# Patient Record
Sex: Female | Born: 1975 | Race: White | Hispanic: No | Marital: Married | Smoking: Former smoker
Health system: Southern US, Community
[De-identification: ages and names within clinical notes are randomized; demographics above are authoritative.]

## PROBLEM LIST (undated history)

## (undated) DIAGNOSIS — R519 Headache, unspecified: Secondary | ICD-10-CM

## (undated) DIAGNOSIS — G8929 Other chronic pain: Secondary | ICD-10-CM

## (undated) DIAGNOSIS — G90A Postural orthostatic tachycardia syndrome (POTS): Secondary | ICD-10-CM

## (undated) DIAGNOSIS — T148XXA Other injury of unspecified body region, initial encounter: Secondary | ICD-10-CM

## (undated) DIAGNOSIS — R0600 Dyspnea, unspecified: Secondary | ICD-10-CM

## (undated) DIAGNOSIS — E785 Hyperlipidemia, unspecified: Secondary | ICD-10-CM

## (undated) DIAGNOSIS — J189 Pneumonia, unspecified organism: Secondary | ICD-10-CM

## (undated) DIAGNOSIS — Z87442 Personal history of urinary calculi: Secondary | ICD-10-CM

## (undated) DIAGNOSIS — F32A Depression, unspecified: Secondary | ICD-10-CM

## (undated) DIAGNOSIS — T8859XA Other complications of anesthesia, initial encounter: Secondary | ICD-10-CM

## (undated) DIAGNOSIS — D649 Anemia, unspecified: Secondary | ICD-10-CM

## (undated) DIAGNOSIS — F419 Anxiety disorder, unspecified: Secondary | ICD-10-CM

## (undated) DIAGNOSIS — I498 Other specified cardiac arrhythmias: Secondary | ICD-10-CM

## (undated) DIAGNOSIS — N200 Calculus of kidney: Secondary | ICD-10-CM

## (undated) DIAGNOSIS — I639 Cerebral infarction, unspecified: Secondary | ICD-10-CM

## (undated) DIAGNOSIS — K219 Gastro-esophageal reflux disease without esophagitis: Secondary | ICD-10-CM

## (undated) DIAGNOSIS — M797 Fibromyalgia: Secondary | ICD-10-CM

## (undated) HISTORY — DX: Postural orthostatic tachycardia syndrome (POTS): G90.A

## (undated) HISTORY — DX: Other specified cardiac arrhythmias: I49.8

## (undated) HISTORY — DX: Headache, unspecified: R51.9

## (undated) HISTORY — DX: Hyperlipidemia, unspecified: E78.5

## (undated) HISTORY — DX: Cerebral infarction, unspecified: I63.9

## (undated) HISTORY — DX: Depression, unspecified: F32.A

## (undated) HISTORY — DX: Other chronic pain: G89.29

## (undated) HISTORY — DX: Anemia, unspecified: D64.9

## (undated) HISTORY — DX: Calculus of kidney: N20.0

## (undated) HISTORY — DX: Other injury of unspecified body region, initial encounter: T14.8XXA

## (undated) HISTORY — PX: WISDOM TOOTH EXTRACTION: SHX21

## (undated) HISTORY — DX: Fibromyalgia: M79.7

## (undated) HISTORY — DX: Anxiety disorder, unspecified: F41.9

---
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
## 2012-05-04 HISTORY — PX: PARTIAL HYSTERECTOMY: SHX80

## 2017-03-08 LAB — HM PAP SMEAR: HM Pap smear: NORMAL

## 2020-03-02 ENCOUNTER — Other Ambulatory Visit: Payer: Self-pay

## 2020-03-02 ENCOUNTER — Emergency Department: Payer: 59

## 2020-03-02 ENCOUNTER — Emergency Department
Admission: EM | Admit: 2020-03-02 | Discharge: 2020-03-02 | Disposition: A | Discharge status: Home / Self Care | Payer: 59 | Attending: Emergency Medicine | Admitting: Emergency Medicine

## 2020-03-02 DIAGNOSIS — R519 Headache, unspecified: Secondary | ICD-10-CM | POA: Diagnosis not present

## 2020-03-02 DIAGNOSIS — R531 Weakness: Secondary | ICD-10-CM | POA: Diagnosis not present

## 2020-03-02 LAB — CBC WITH DIFFERENTIAL/PLATELET
Abs Immature Granulocytes: 0.02 10*3/uL (ref 0.00–0.07)
Basophils Absolute: 0.1 10*3/uL (ref 0.0–0.1)
Basophils Relative: 1 %
Eosinophils Absolute: 0.3 10*3/uL (ref 0.0–0.5)
Eosinophils Relative: 4 %
HCT: 37.8 % (ref 36.0–46.0)
Hemoglobin: 12.9 g/dL (ref 12.0–15.0)
Immature Granulocytes: 0 %
Lymphocytes Relative: 32 %
Lymphs Abs: 2.4 10*3/uL (ref 0.7–4.0)
MCH: 30.5 pg (ref 26.0–34.0)
MCHC: 34.1 g/dL (ref 30.0–36.0)
MCV: 89.4 fL (ref 80.0–100.0)
Monocytes Absolute: 0.7 10*3/uL (ref 0.1–1.0)
Monocytes Relative: 10 %
Neutro Abs: 4 10*3/uL (ref 1.7–7.7)
Neutrophils Relative %: 53 %
Platelets: 285 10*3/uL (ref 150–400)
RBC: 4.23 MIL/uL (ref 3.87–5.11)
RDW: 11.9 % (ref 11.5–15.5)
WBC: 7.5 10*3/uL (ref 4.0–10.5)
nRBC: 0 % (ref 0.0–0.2)

## 2020-03-02 LAB — URINALYSIS, COMPLETE (UACMP) WITH MICROSCOPIC
Bacteria, UA: NONE SEEN
Bilirubin Urine: NEGATIVE
Glucose, UA: NEGATIVE mg/dL
Hgb urine dipstick: NEGATIVE
Ketones, ur: NEGATIVE mg/dL
Leukocytes,Ua: NEGATIVE
Nitrite: NEGATIVE
Protein, ur: NEGATIVE mg/dL
Specific Gravity, Urine: 1.028 (ref 1.005–1.030)
WBC, UA: NONE SEEN WBC/hpf (ref 0–5)
pH: 6 (ref 5.0–8.0)

## 2020-03-02 LAB — COMPREHENSIVE METABOLIC PANEL
ALT: 37 U/L (ref 0–44)
AST: 35 U/L (ref 15–41)
Albumin: 4.5 g/dL (ref 3.5–5.0)
Alkaline Phosphatase: 81 U/L (ref 38–126)
Anion gap: 10 (ref 5–15)
BUN: 13 mg/dL (ref 6–20)
CO2: 28 mmol/L (ref 22–32)
Calcium: 9.3 mg/dL (ref 8.9–10.3)
Chloride: 102 mmol/L (ref 98–111)
Creatinine, Ser: 0.8 mg/dL (ref 0.44–1.00)
GFR, Estimated: >60 mL/min (ref >60)
Glucose, Bld: 93 mg/dL (ref 70–99)
Potassium: 3.6 mmol/L (ref 3.5–5.1)
Sodium: 140 mmol/L (ref 135–145)
Total Bilirubin: 0.7 mg/dL (ref 0.3–1.2)
Total Protein: 7.4 g/dL (ref 6.5–8.1)

## 2020-03-02 LAB — URINE DRUG SCREEN, QUALITATIVE (ARMC ONLY)
Amphetamines, Ur Screen: NOT DETECTED
Barbiturates, Ur Screen: NOT DETECTED
Benzodiazepine, Ur Scrn: NOT DETECTED
Cannabinoid 50 Ng, Ur ~~LOC~~: NOT DETECTED
Cocaine Metabolite,Ur ~~LOC~~: NOT DETECTED
MDMA (Ecstasy)Ur Screen: NOT DETECTED
Methadone Scn, Ur: NOT DETECTED
Opiate, Ur Screen: NOT DETECTED
Phencyclidine (PCP) Ur S: NOT DETECTED
Tricyclic, Ur Screen: NOT DETECTED

## 2020-03-02 MED ORDER — IOHEXOL 350 MG/ML SOLN
75.0000 mL | Freq: Once | INTRAVENOUS | Status: AC | PRN
  Administered 2020-03-02: 75 mL via INTRAVENOUS

## 2020-03-02 MED ORDER — SODIUM CHLORIDE 0.9 % IV BOLUS
1000.0000 mL | Freq: Once | INTRAVENOUS | Status: AC
  Administered 2020-03-02: 1000 mL via INTRAVENOUS

## 2020-03-02 MED ORDER — DEXAMETHASONE SODIUM PHOSPHATE 4 MG/ML IJ SOLN
4.0000 mg | Freq: Once | INTRAMUSCULAR | Status: AC
  Administered 2020-03-02: 4 mg via INTRAVENOUS
  Filled 2020-03-02: qty 1

## 2020-03-02 MED ORDER — PROCHLORPERAZINE EDISYLATE 10 MG/2ML IJ SOLN
10.0000 mg | Freq: Once | INTRAMUSCULAR | Status: AC
  Administered 2020-03-02: 10 mg via INTRAVENOUS
  Filled 2020-03-02: qty 2

## 2020-03-02 NOTE — ED Triage Notes (Signed)
Pt c/o worst headache of life.

## 2020-03-02 NOTE — Discharge Instructions (Addendum)
Please follow-up with your regular doctor.  If you do not have a local doctor you can try [facility] or [facility] or [facility] medical or Dr. Elisabeth Cara practice.  I do not remember what his group is called now.  They are all very good.  They can have you follow-up with neurology if need be.  Please return here if you get a headache as severe as this 1 again.  I think this was a migraine.

## 2020-03-02 NOTE — ED Notes (Addendum)
Pt reports sudden headache today. Worst in her life. Reports weakness in BLE, denies trauma/injury, LOC. LKN [id number]

## 2020-03-02 NOTE — ED Notes (Signed)
Sig pad not working. AVS given to patient, patient verbalized understanding with no questions.

## 2020-03-02 NOTE — ED Provider Notes (Signed)
[facility] Emergency Department Provider Note   ____________________________________________   First MD Initiated Contact with Patient 03/02/20 1829     (approximate)  I have reviewed the triage vital signs and the nursing notes.   HISTORY  Chief Complaint Loss of Consciousness History limited by severe headache  HPI Jean Rios is a 44 y.o. female who comes in with her husband.  He gives the history as she is not feeling well at all.  He reports she had a mild headache when they went out to eat it pumpkin patch and when she got home she was not feeling well laid down and she called him while he was cooking dinner and she said she did not feel good wanted to go to the emergency room and then she became very limp.  Passed out.  She has a history of an old stroke and 2009 at that time she lost the ability to speak for about a week or 2.  That has resolved and she only occasionally has a little bit of word finding difficulty now.  Patient herself told me that she had sudden or very rapid onset of the worst headache she has ever had.  She does have a history of migraines but she is never had anything quite this bad.  Headache appears to be diffuse.  She is not having nausea or vomiting at this time.         No past medical history on file. Patient reportedly has a history of an old stroke/TIA with no deficits There are no problems to display for this patient.     Prior to Admission medications   Not on File    Allergies Patient has no known allergies.  No family history on file.  Social History Social History   Tobacco Use  . Smoking status: Not on file  Substance Use Topics  . Alcohol use: Not on file  . Drug use: Not on file    Review of Systems  Constitutional: No fever/chills Eyes: No visual changes. ENT: No sore throat. Cardiovascular: Denies chest pain. Respiratory: Denies shortness of breath. Gastrointestinal: No  abdominal pain.  No nausea, no vomiting.  No diarrhea.  No constipation. Genitourinary: Negative for dysuria. Musculoskeletal: Negative for back pain. Skin: Negative for rash. Neurological: Negative for focal weakness   ____________________________________________   PHYSICAL EXAM:  VITAL SIGNS: ED Triage Vitals  Enc Vitals Group     BP 03/02/20 1823 (!) 160/88     Pulse Rate 03/02/20 1823 73     Resp 03/02/20 1823 17     Temp --      Temp src --      SpO2 03/02/20 1823 100 %     Weight --      Height --      Head Circumference --      Peak Flow --      Pain Score 03/02/20 1824 10     Pain Loc --      Pain Edu? --      Excl. in GC? --     Constitutional: Alert Complaining of severe headache Eyes: Patient reports her eyes hurt when she looks at light.  Was able to open her eyes briefly.  Her pupils are equal and round and reactive conjunctiva are clear Head: Atraumatic. Nose: No congestion/rhinnorhea. Mouth/Throat: Mucous membranes are moist.  Oropharynx non-erythematous. Neck: No stridor.   Cardiovascular: Normal rate, regular rhythm. Grossly normal heart sounds.  Good peripheral  circulation. Respiratory: Normal respiratory effort.  No retractions. Lungs CTAB. Gastrointestinal: Soft and nontender. No distention. No abdominal bruits.  Musculoskeletal: No lower extremity tenderness nor edema.   Neurologic: Patient is diffusely weak.  She has difficulty lifting her legs off the bed bilaterally.  She has difficulty holding her arms up with her palms out.  She has difficulty squeezing my fingers hard.. Skin:  Skin is warm, dry and intact. No rash noted.   ____________________________________________   LABS (all labs ordered are listed, but only abnormal results are displayed)  Labs Reviewed  URINALYSIS, COMPLETE (UACMP) WITH MICROSCOPIC - Abnormal; Notable for the following components:      Result Value   Color, Urine STRAW (*)    APPearance CLEAR (*)    All other  components within normal limits  COMPREHENSIVE METABOLIC PANEL  URINE DRUG SCREEN, QUALITATIVE ([facility] ONLY)  CBC WITH DIFFERENTIAL/PLATELET   ____________________________________________  EKG  EKG read interpreted by me shows normal sinus rhythm rate of 64 normal axis there is a flipped T isolated in V3 otherwise no acute ST-T changes ____________________________________________  RADIOLOGY Jill Poling, personally viewed and evaluated these images (plain radiographs) as part of my medical decision making, as well as reviewing the written report by the radiologist.  ED MD interpretation:    Official radiology report(s): CT Angio Head W or Wo Contrast  Result Date: 03/02/2020 CLINICAL DATA:  Stroke/TIA. EXAM: CT ANGIOGRAPHY HEAD TECHNIQUE: Multidetector CT imaging of the head was performed using the standard protocol during bolus administration of intravenous contrast. Multiplanar CT image reconstructions and MIPs were obtained to evaluate the vascular anatomy. CONTRAST:  52mL OMNIPAQUE IOHEXOL 350 MG/ML SOLN COMPARISON:  03/02/2020 noncontrast head CT. FINDINGS: CTA HEAD Anterior circulation: Patent. No significant stenosis, proximal occlusion, aneurysm, or vascular malformation. Posterior circulation: Patent. No significant stenosis, proximal occlusion, aneurysm, or vascular malformation. Venous sinuses: As permitted by contrast timing, patent. Anatomic variants: The left PCOM is either diminutive or congenitally absent. IMPRESSION: No large vessel occlusion, high-grade narrowing, dissection or aneurysm. Electronically Signed   By: Stana Bunting M.D.   On: 03/02/2020 20:51   CT Head Wo Contrast  Result Date: 03/02/2020 CLINICAL DATA:  Headache with bilateral lower extremity weakness. EXAM: CT HEAD WITHOUT CONTRAST TECHNIQUE: Contiguous axial images were obtained from the base of the skull through the vertex without intravenous contrast. COMPARISON:  None. FINDINGS: Brain: No  evidence of acute infarction, hemorrhage, hydrocephalus, extra-axial collection or mass lesion/mass effect. Vascular: No hyperdense vessel or unexpected calcification. Skull: Normal. Negative for fracture or focal lesion. Sinuses/Orbits: No acute finding. Other: None. IMPRESSION: No acute intracranial pathology. Electronically Signed   By: Aram Candela M.D.   On: 03/02/2020 19:13    ____________________________________________   PROCEDURES  Procedure(s) performed (including Critical Care):  Procedures   ____________________________________________   INITIAL IMPRESSION / ASSESSMENT AND PLAN / ED COURSE Husband reports patient could not be pregnant.  She has had a partial hysterectomy and her tubes have been tied and burned. ----------------------------------------- 11:00 PM on 03/02/2020 -----------------------------------------  Patient's headache is now much better.  CT and CT angio were both negative.  She should be safe to go.  I will let her go home come back if she has any further problems at all and follow-up with local doctor since she only has one in [place] now.             ____________________________________________   FINAL CLINICAL IMPRESSION(S) / ED DIAGNOSES  Final diagnoses:  Nonintractable  headache, unspecified chronicity pattern, unspecified headache type     ED Discharge Orders    None      *Please note:  Jean Rios was evaluated in Emergency Department on 03/02/2020 for the symptoms described in the history of present illness. She was evaluated in the context of the global COVID-19 pandemic, which necessitated consideration that the patient might be at risk for infection with the SARS-CoV-2 virus that causes COVID-19. Institutional protocols and algorithms that pertain to the evaluation of patients at risk for COVID-19 are in a state of rapid change based on information released by regulatory bodies including the CDC and federal and  state organizations. These policies and algorithms were followed during the patient's care in the ED.  Some ED evaluations and interventions may be delayed as a result of limited staffing during and the pandemic.*   Note:  This document was prepared using Dragon voice recognition software and may include unintentional dictation errors.    Arnaldo Natal, MD 03/02/20 [phone]

## 2021-01-21 ENCOUNTER — Other Ambulatory Visit: Payer: Self-pay

## 2021-01-21 ENCOUNTER — Ambulatory Visit (INDEPENDENT_AMBULATORY_CARE_PROVIDER_SITE_OTHER): Payer: 59 | Admitting: Internal Medicine

## 2021-01-21 ENCOUNTER — Encounter: Payer: Self-pay | Admitting: Internal Medicine

## 2021-01-21 VITALS — BP 101/59 | HR 85 | Temp 98.6°F | Resp 18 | Ht 64.0 in | Wt 148.8 lb

## 2021-01-21 DIAGNOSIS — R519 Headache, unspecified: Secondary | ICD-10-CM | POA: Insufficient documentation

## 2021-01-21 DIAGNOSIS — F32A Depression, unspecified: Secondary | ICD-10-CM

## 2021-01-21 DIAGNOSIS — M797 Fibromyalgia: Secondary | ICD-10-CM | POA: Insufficient documentation

## 2021-01-21 DIAGNOSIS — R7989 Other specified abnormal findings of blood chemistry: Secondary | ICD-10-CM

## 2021-01-21 DIAGNOSIS — G90A Postural orthostatic tachycardia syndrome (POTS): Secondary | ICD-10-CM

## 2021-01-21 DIAGNOSIS — F419 Anxiety disorder, unspecified: Secondary | ICD-10-CM | POA: Insufficient documentation

## 2021-01-21 DIAGNOSIS — D500 Iron deficiency anemia secondary to blood loss (chronic): Secondary | ICD-10-CM

## 2021-01-21 DIAGNOSIS — D649 Anemia, unspecified: Secondary | ICD-10-CM | POA: Insufficient documentation

## 2021-01-21 DIAGNOSIS — E7800 Pure hypercholesterolemia, unspecified: Secondary | ICD-10-CM | POA: Insufficient documentation

## 2021-01-21 DIAGNOSIS — G8929 Other chronic pain: Secondary | ICD-10-CM | POA: Insufficient documentation

## 2021-01-21 DIAGNOSIS — G4700 Insomnia, unspecified: Secondary | ICD-10-CM | POA: Insufficient documentation

## 2021-01-21 DIAGNOSIS — Z8673 Personal history of transient ischemic attack (TIA), and cerebral infarction without residual deficits: Secondary | ICD-10-CM | POA: Insufficient documentation

## 2021-01-21 DIAGNOSIS — F5104 Psychophysiologic insomnia: Secondary | ICD-10-CM

## 2021-01-21 DIAGNOSIS — M5441 Lumbago with sciatica, right side: Secondary | ICD-10-CM

## 2021-01-21 DIAGNOSIS — M549 Dorsalgia, unspecified: Secondary | ICD-10-CM | POA: Insufficient documentation

## 2021-01-21 DIAGNOSIS — M5442 Lumbago with sciatica, left side: Secondary | ICD-10-CM

## 2021-01-21 DIAGNOSIS — I498 Other specified cardiac arrhythmias: Secondary | ICD-10-CM | POA: Insufficient documentation

## 2021-01-21 LAB — COMPLETE METABOLIC PANEL WITH GFR
AG Ratio: 1.8 (calc) (ref 1.0–2.5)
ALT: 15 U/L (ref 6–29)
AST: 17 U/L (ref 10–30)
Albumin: 4.4 g/dL (ref 3.6–5.1)
Alkaline phosphatase (APISO): 72 U/L (ref 31–125)
BUN/Creatinine Ratio: 11 (calc) (ref 6–22)
BUN: 15 mg/dL (ref 7–25)
CO2: 30 mmol/L (ref 20–32)
Calcium: 9.4 mg/dL (ref 8.6–10.2)
Chloride: 104 mmol/L (ref 98–110)
Creat: 1.31 mg/dL — ABNORMAL HIGH (ref 0.50–0.99)
Globulin: 2.4 g/dL (calc) (ref 1.9–3.7)
Glucose, Bld: 86 mg/dL (ref 65–139)
Potassium: 4 mmol/L (ref 3.5–5.3)
Sodium: 140 mmol/L (ref 135–146)
Total Bilirubin: 0.5 mg/dL (ref 0.2–1.2)
Total Protein: 6.8 g/dL (ref 6.1–8.1)
eGFR: 52 mL/min/{1.73_m2} — ABNORMAL LOW (ref >=60)

## 2021-01-21 LAB — LIPID PANEL
Cholesterol: 251 mg/dL — ABNORMAL HIGH (ref <200)
HDL: 53 mg/dL (ref >=50)
LDL Cholesterol (Calc): 176 mg/dL (calc) — ABNORMAL HIGH
Non-HDL Cholesterol (Calc): 198 mg/dL (calc) — ABNORMAL HIGH (ref <130)
Total CHOL/HDL Ratio: 4.7 (calc) (ref <5.0)
Triglycerides: 102 mg/dL (ref <150)

## 2021-01-21 MED ORDER — VENLAFAXINE HCL ER 75 MG PO CP24: 75.0000 mg | ORAL_CAPSULE | Freq: Every day | ORAL | 2 refills | Status: DC

## 2021-01-21 NOTE — Assessment & Plan Note (Signed)
CMET and lipid profile today Encouraged her to consume a low fat diet 

## 2021-01-21 NOTE — Assessment & Plan Note (Signed)
Encouraged her to try to avoid triggers Continue Ibuprofen as needed

## 2021-01-21 NOTE — Assessment & Plan Note (Signed)
Will trial Venlafaxine 75 mg daily Referral to psychology placed for therapy Support offered  Update me in 1 month via mychart and let me know how you are doing

## 2021-01-21 NOTE — Progress Notes (Signed)
HPI  Pt presents to the clinic today to establish care and for management of the conditions listed below. She recently moved from [place].  Anemia: Her last H/H was 12.9/37.8, 02/2020. She is not currently taking an oral iron supplement.  She does not follow with hematology.  Anxiety and Depression: Chronic. She has taken Escitalopram in the past but stopped taking it because she felt like it was no longer effective. She has also take Paroxetine, Citalopram and Abilify in the past. She is not currently seeing a therapist. She denies SI/HI.  Insomnia: She has difficulty staying asleep. She has take Hydroxyzine in the past but reports it is not effective in helping her stay asleep. She has tried Trazadone in the past. There is no sleep study on file.  HLD with History of TIA: There is no lipid profile in Epic. She reports some residual expressive aphasia, with right sided weakness. She has been out of her Atorvastatin for almost a year.  Frequent Headaches: These occur 1-2 times per week. She reports these are triggered by weather, eye strain. She takes Ibuprofen and rests in a dark room with good relief of symptoms.  POTS: She denies recent syncopal episode. She tries to avoid standing up too fast.  Chronic Back Pain/Fibromyalgia: There is no xray or MRI to review. She reports this is the cause of a work injury. She takes CBD and had a medical marijuana for severe pain.  Past Medical History:  Diagnosis Date   Anemia    Anxiety    Depression    Frequent headaches    Hyperlipidemia    Kidney stone    Stroke [facility])     No current outpatient medications on file.   No current facility-administered medications for this visit.    Allergies  Allergen Reactions   Cinnamon Itching    Throat gets irritated, and have difficulty to breath    No family history on file.  Social History   Socioeconomic History   Marital status: Married    Spouse name: Not on file   Number of children:  Not on file   Years of education: Not on file   Highest education level: Not on file  Occupational History   Not on file  Tobacco Use   Smoking status: Not on file   Smokeless tobacco: Not on file  Substance and Sexual Activity   Alcohol use: Not on file   Drug use: Not on file   Sexual activity: Not on file  Other Topics Concern   Not on file  Social History Narrative   Not on file   Social Determinants of Health   Financial Resource Strain: Not on file  Food Insecurity: Not on file  Transportation Needs: Not on file  Physical Activity: Not on file  Stress: Not on file  Social Connections: Not on file  Intimate Partner Violence: Not on file    ROS:  Constitutional: Pt reports headaches. Denies fever, malaise, fatigue, or abrupt weight changes.  HEENT: Denies eye pain, eye redness, ear pain, ringing in the ears, wax buildup, runny nose, nasal congestion, bloody nose, or sore throat. Respiratory: Denies difficulty breathing, shortness of breath, cough or sputum production.   Cardiovascular: Denies chest pain, chest tightness, palpitations or swelling in the hands or feet.  Gastrointestinal: Denies abdominal pain, bloating, constipation, diarrhea or blood in the stool.  GU: Denies frequency, urgency, pain with urination, blood in urine, odor or discharge. Musculoskeletal: Pt reports chronic back pain, muscle pain.  Denies decrease in range of motion, difficulty with gait, or joint swelling.  Skin: Denies redness, rashes, lesions or ulcercations.  Neurological: Pt reports numbness and tingling in her extremities, expressive aphasia, right sided weakness, insomnia. Denies dizziness, difficulty with memory, difficulty with speech or problems with balance and coordination.  Psych: Pt has a history of anxiety and depression. Denies SI/HI.  No other specific complaints in a complete review of systems (except as listed in HPI above).  PE:  BP (!) 101/59 (BP Location: Right Arm,  Patient Position: Sitting, Cuff Size: Normal)   Pulse 85   Temp 98.6 F (37 C) (Temporal)   Resp 18   Ht 5\' 4"  (1.626 m)   Wt 148 lb 12.8 oz (67.5 kg)   SpO2 99%   BMI 25.54 kg/m  Wt Readings from Last 3 Encounters:  01/21/21 148 lb 12.8 oz (67.5 kg)    General: Appears her stated age, overweight in NAD. Skin: Dry and intact. HEENT: Head: normal shape and size; Eyes: sclera white and EOMs intact;  Cardiovascular: Normal rate and rhythm. S1,S2 noted.  No murmur, rubs or gallops noted. No JVD or BLE edema. No carotid bruits noted. Pulmonary/Chest: Normal effort and positive vesicular breath sounds. No respiratory distress. No wheezes, rales or ronchi noted.  Musculoskeletal: Strength 5/5 BUE/BLE. No difficulty with gait.  Neurological: Alert and oriented.  Psychiatric: Mood and affect normal. Behavior is normal. Judgment and thought content normal.     BMET    Component Value Date/Time   NA 140 03/02/2020 1828   K 3.6 03/02/2020 1828   CL 102 03/02/2020 1828   CO2 28 03/02/2020 1828   GLUCOSE 93 03/02/2020 1828   BUN 13 03/02/2020 1828   CREATININE 0.80 03/02/2020 1828   CALCIUM 9.3 03/02/2020 1828   GFRNONAA >60 03/02/2020 1828    Lipid Panel  No results found for: CHOL, TRIG, HDL, CHOLHDL, VLDL, LDLCALC  CBC    Component Value Date/Time   WBC 7.5 03/02/2020 1828   RBC 4.23 03/02/2020 1828   HGB 12.9 03/02/2020 1828   HCT 37.8 03/02/2020 1828   PLT 285 03/02/2020 1828   MCV 89.4 03/02/2020 1828   MCH 30.5 03/02/2020 1828   MCHC 34.1 03/02/2020 1828   RDW 11.9 03/02/2020 1828   LYMPHSABS 2.4 03/02/2020 1828   MONOABS 0.7 03/02/2020 1828   EOSABS 0.3 03/02/2020 1828   BASOSABS 0.1 03/02/2020 1828    Hgb A1C No results found for: HGBA1C   Assessment and Plan:   03/04/2020, NP This visit occurred during the SARS-CoV-2 public health emergency.  Safety protocols were in place, including screening questions prior to the visit, additional usage of staff  PPE, and extensive cleaning of exam room while observing appropriate contact time as indicated for disinfecting solutions.

## 2021-01-21 NOTE — Assessment & Plan Note (Signed)
Currently not an issue Will monitor CBC yearly

## 2021-01-21 NOTE — Patient Instructions (Signed)

## 2021-01-21 NOTE — Assessment & Plan Note (Signed)
Will trial Trazadone again once she is stabalized on Venlafaxine

## 2021-01-21 NOTE — Assessment & Plan Note (Signed)
Encouraged her to change positions slowly Encouraged adequate water intake

## 2021-01-21 NOTE — Assessment & Plan Note (Signed)
Encouraged regular stretching and core strengthening

## 2021-01-21 NOTE — Assessment & Plan Note (Signed)
Encouraged regular stretching and core strengthening 

## 2021-01-22 ENCOUNTER — Other Ambulatory Visit: Payer: Self-pay

## 2021-01-22 DIAGNOSIS — E7800 Pure hypercholesterolemia, unspecified: Secondary | ICD-10-CM

## 2021-01-22 MED ORDER — ATORVASTATIN CALCIUM 10 MG PO TABS: 10.0000 mg | ORAL_TABLET | Freq: Every day | ORAL | 3 refills | Status: DC

## 2021-01-22 NOTE — Addendum Note (Signed)
Addended by: Lorre Munroe on: 01/22/2021 10:16 AM   Modules accepted: Orders

## 2021-01-31 ENCOUNTER — Encounter: Payer: Self-pay | Admitting: Internal Medicine

## 2021-02-04 ENCOUNTER — Other Ambulatory Visit: Payer: Self-pay

## 2021-02-04 DIAGNOSIS — R7989 Other specified abnormal findings of blood chemistry: Secondary | ICD-10-CM

## 2021-02-05 LAB — BASIC METABOLIC PANEL WITH GFR
BUN: 12 mg/dL (ref 7–25)
CO2: 26 mmol/L (ref 20–32)
Calcium: 9.3 mg/dL (ref 8.6–10.2)
Chloride: 104 mmol/L (ref 98–110)
Creat: 0.89 mg/dL (ref 0.50–0.99)
Glucose, Bld: 86 mg/dL (ref 65–139)
Potassium: 3.8 mmol/L (ref 3.5–5.3)
Sodium: 139 mmol/L (ref 135–146)
eGFR: 82 mL/min/{1.73_m2} (ref >=60)

## 2021-02-13 ENCOUNTER — Other Ambulatory Visit: Payer: Self-pay | Admitting: Internal Medicine

## 2021-02-13 NOTE — Telephone Encounter (Signed)
Requested medication (s) are due for refill today: Yes  Requested medication (s) are on the active medication list: Yes  Last refill:  01/21/21  Future visit scheduled: No  Notes to clinic:  Pharmacy requesting refill for 90 day supply. Routing to provider for approval.     Requested Prescriptions  Pending Prescriptions Disp Refills   venlafaxine XR (EFFEXOR-XR) 75 MG 24 hr capsule [Pharmacy Med Name: VENLAFAXINE HCL ER 75 MG CAP] 90 capsule 1    Sig: TAKE 1 CAPSULE BY MOUTH DAILY WITH BREAKFAST.     Psychiatry: Antidepressants - SNRI - desvenlafaxine & venlafaxine Failed - 02/13/2021  8:32 AM      Failed - LDL in normal range and within 360 days    LDL Cholesterol (Calc)  Date Value Ref Range Status  01/21/2021 176 (H) mg/dL (calc) Final    Comment:    Reference range: <100 . Desirable range <100 mg/dL for primary prevention;   <70 mg/dL for patients with CHD or diabetic patients  with > or = 2 CHD risk factors. Marland Kitchen LDL-C is now calculated using the Martin-Hopkins  calculation, which is a validated novel method providing  better accuracy than the Friedewald equation in the  estimation of LDL-C.  Horald Pollen et al. Lenox Ahr. 1610;960(45): 2061-2068  (http://education.[facility].com/faq/FAQ164)           Failed - Total Cholesterol in normal range and within 360 days    Cholesterol  Date Value Ref Range Status  01/21/2021 251 (H) <200 mg/dL Final          Passed - Triglycerides in normal range and within 360 days    Triglycerides  Date Value Ref Range Status  01/21/2021 102 <150 mg/dL Final          Passed - Completed PHQ-2 or PHQ-9 in the last 360 days      Passed - Last BP in normal range    BP Readings from Last 1 Encounters:  01/21/21 (!) 101/59          Passed - Valid encounter within last 6 months    Recent Outpatient Visits           3 weeks ago Pure hypercholesterolemia   [facility] [place], [place], [place]

## 2021-03-03 ENCOUNTER — Emergency Department
Admission: EM | Admit: 2021-03-03 | Discharge: 2021-03-03 | Disposition: A | Discharge status: Home / Self Care | Payer: 59 | Attending: Emergency Medicine | Admitting: Emergency Medicine

## 2021-03-03 ENCOUNTER — Other Ambulatory Visit: Payer: Self-pay

## 2021-03-03 ENCOUNTER — Ambulatory Visit: Payer: Self-pay | Admitting: *Deleted

## 2021-03-03 ENCOUNTER — Emergency Department (HOSPITAL_BASED_OUTPATIENT_CLINIC_OR_DEPARTMENT_OTHER)
Admission: EM | Admit: 2021-03-03 | Discharge: 2021-03-03 | Disposition: A | Discharge status: Home / Self Care | Payer: 59 | Attending: Student | Admitting: Student

## 2021-03-03 ENCOUNTER — Encounter (HOSPITAL_BASED_OUTPATIENT_CLINIC_OR_DEPARTMENT_OTHER): Payer: Self-pay | Admitting: Emergency Medicine

## 2021-03-03 ENCOUNTER — Encounter: Payer: Self-pay | Admitting: Emergency Medicine

## 2021-03-03 ENCOUNTER — Emergency Department (HOSPITAL_BASED_OUTPATIENT_CLINIC_OR_DEPARTMENT_OTHER): Payer: 59

## 2021-03-03 DIAGNOSIS — Z87891 Personal history of nicotine dependence: Secondary | ICD-10-CM | POA: Diagnosis not present

## 2021-03-03 DIAGNOSIS — R110 Nausea: Secondary | ICD-10-CM | POA: Diagnosis not present

## 2021-03-03 DIAGNOSIS — R109 Unspecified abdominal pain: Secondary | ICD-10-CM

## 2021-03-03 DIAGNOSIS — Z5321 Procedure and treatment not carried out due to patient leaving prior to being seen by health care provider: Secondary | ICD-10-CM | POA: Insufficient documentation

## 2021-03-03 DIAGNOSIS — Z8719 Personal history of other diseases of the digestive system: Secondary | ICD-10-CM | POA: Diagnosis not present

## 2021-03-03 DIAGNOSIS — K922 Gastrointestinal hemorrhage, unspecified: Secondary | ICD-10-CM | POA: Insufficient documentation

## 2021-03-03 DIAGNOSIS — K921 Melena: Secondary | ICD-10-CM

## 2021-03-03 LAB — URINALYSIS, ROUTINE W REFLEX MICROSCOPIC
Bacteria, UA: NONE SEEN
Bilirubin Urine: NEGATIVE
Glucose, UA: NEGATIVE mg/dL
Ketones, ur: NEGATIVE mg/dL
Leukocytes,Ua: NEGATIVE
Nitrite: NEGATIVE
Protein, ur: NEGATIVE mg/dL
Specific Gravity, Urine: 1.006 (ref 1.005–1.030)
pH: 6 (ref 5.0–8.0)

## 2021-03-03 LAB — COMPREHENSIVE METABOLIC PANEL
ALT: 26 U/L (ref 0–44)
AST: 27 U/L (ref 15–41)
Albumin: 4.6 g/dL (ref 3.5–5.0)
Alkaline Phosphatase: 71 U/L (ref 38–126)
Anion gap: 7 (ref 5–15)
BUN: 13 mg/dL (ref 6–20)
CO2: 29 mmol/L (ref 22–32)
Calcium: 9.1 mg/dL (ref 8.9–10.3)
Chloride: 102 mmol/L (ref 98–111)
Creatinine, Ser: 0.98 mg/dL (ref 0.44–1.00)
GFR, Estimated: >60 mL/min (ref >60)
Glucose, Bld: 98 mg/dL (ref 70–99)
Potassium: 3.9 mmol/L (ref 3.5–5.1)
Sodium: 138 mmol/L (ref 135–145)
Total Bilirubin: 0.9 mg/dL (ref 0.3–1.2)
Total Protein: 7.4 g/dL (ref 6.5–8.1)

## 2021-03-03 LAB — CBC
HCT: 40.9 % (ref 36.0–46.0)
Hemoglobin: 14.3 g/dL (ref 12.0–15.0)
MCH: 31.6 pg (ref 26.0–34.0)
MCHC: 35 g/dL (ref 30.0–36.0)
MCV: 90.3 fL (ref 80.0–100.0)
Platelets: 270 10*3/uL (ref 150–400)
RBC: 4.53 MIL/uL (ref 3.87–5.11)
RDW: 11.8 % (ref 11.5–15.5)
WBC: 5.1 10*3/uL (ref 4.0–10.5)
nRBC: 0 % (ref 0.0–0.2)

## 2021-03-03 LAB — LIPASE, BLOOD: Lipase: 40 U/L (ref 11–51)

## 2021-03-03 LAB — OCCULT BLOOD X 1 CARD TO LAB, STOOL: Fecal Occult Bld: NEGATIVE

## 2021-03-03 LAB — POC URINE PREG, ED: Preg Test, Ur: NEGATIVE

## 2021-03-03 MED ORDER — IOHEXOL 350 MG/ML SOLN
100.0000 mL | Freq: Once | INTRAVENOUS | Status: AC | PRN
  Administered 2021-03-03: 100 mL via INTRAVENOUS

## 2021-03-03 MED ORDER — PANTOPRAZOLE SODIUM 40 MG IV SOLR
40.0000 mg | Freq: Once | INTRAVENOUS | Status: AC
  Administered 2021-03-03: 40 mg via INTRAVENOUS
  Filled 2021-03-03: qty 40

## 2021-03-03 MED ORDER — DICYCLOMINE HCL 10 MG PO CAPS
10.0000 mg | ORAL_CAPSULE | Freq: Once | ORAL | Status: AC
  Administered 2021-03-03: 10 mg via ORAL
  Filled 2021-03-03: qty 1

## 2021-03-03 MED ORDER — KETOROLAC TROMETHAMINE 15 MG/ML IJ SOLN
15.0000 mg | Freq: Once | INTRAMUSCULAR | Status: AC
  Administered 2021-03-03: 15 mg via INTRAVENOUS
  Filled 2021-03-03: qty 1

## 2021-03-03 MED ORDER — PANTOPRAZOLE SODIUM 20 MG PO TBEC: 20.0000 mg | DELAYED_RELEASE_TABLET | Freq: Every day | ORAL | 0 refills | Status: DC

## 2021-03-03 MED ORDER — DICYCLOMINE HCL 20 MG PO TABS: 20.0000 mg | ORAL_TABLET | Freq: Two times a day (BID) | ORAL | 0 refills | Status: DC

## 2021-03-03 NOTE — Telephone Encounter (Signed)
Patient complains of URQ pain for 2 weeks-nausea, constipation with blood present, loss of appetite. Patient states her pain is moderate with it becoming more constant within the last 2 days. Advised ED per protocol.

## 2021-03-03 NOTE — ED Provider Notes (Signed)
[facility] EMERGENCY DEPARTMENT Provider Note   CSN: [id number] Arrival date & time: 03/03/21  1756     History Chief Complaint  Patient presents with   Abdominal Pain    Jean Rios is a 45 y.o. female who presents the emergency department for evaluation of abdominal pain and bright red blood per rectum.  Patient states that over the last 2 to 3 weeks she has had crampy abdominal pain that has now worsened and is worse in the right lower quadrant.  She states that she has had early satiety and a semiintentional weight loss of 15 pounds over the last 3 months.  She has changed her diet with intention of losing weight.  48 hours ago she had an episode of bright red blood per rectum with mucousy stool.  Her stools yesterday also had blood in it, but less then the previous day.  She has a strong family history of colon cancer and had a colonoscopy last year that was unremarkable in [place].  She denies chest pain, shortness of breath, fever, cough, headache or other systemic symptoms.  Patient went to [facility] today and laboratory evaluation was obtained but the patient left before being seen by provider.  CBC, CHEM, lipase, UA and urine pregnancy unremarkable.   Abdominal Pain     Past Medical History:  Diagnosis Date   Anemia    Anxiety    Chronic back pain    Depression    Fibromyalgia    Frequent headaches    Hyperlipidemia    Kidney stone    Nerve damage    POTS (postural orthostatic tachycardia syndrome)    Stroke [facility])     Patient Active Problem List   Diagnosis Date Noted   Anemia 01/21/2021   Anxiety and depression 01/21/2021   Insomnia 01/21/2021   History of TIA (transient ischemic attack) 01/21/2021   Pure hypercholesterolemia 01/21/2021   Frequent headaches 01/21/2021   POTS (postural orthostatic tachycardia syndrome) 01/21/2021   Chronic back pain 01/21/2021   Fibromyalgia 01/21/2021    Past Surgical History:   Procedure Laterality Date   PARTIAL HYSTERECTOMY  2014     OB History   No obstetric history on file.     No family history on file.  Social History   Tobacco Use   Smoking status: Former    Types: Cigarettes   Smokeless tobacco: Former    Types: Snuff  Substance Use Topics   Alcohol use: Yes    Alcohol/week: 3.0 standard drinks    Types: 3 Standard drinks or equivalent per week   Drug use: Not Currently    Types: Marijuana, Methamphetamines    Home Medications Prior to Admission medications   Medication Sig Start Date End Date Taking? Authorizing Provider  atorvastatin (LIPITOR) 10 MG tablet Take 1 tablet (10 mg total) by mouth daily. 01/22/21   Lorre Munroe, NP  venlafaxine XR (EFFEXOR-XR) 75 MG 24 hr capsule TAKE 1 CAPSULE BY MOUTH DAILY WITH BREAKFAST. 02/16/21   Lorre Munroe, NP    Allergies    Cinnamon  Review of Systems   Review of Systems  Gastrointestinal:  Positive for abdominal pain.   Physical Exam Updated Vital Signs BP (!) 154/82   Pulse 64   Temp 98.3 F (36.8 C) (Oral)   Resp 18   SpO2 100%   Physical Exam Vitals and nursing note reviewed.  Constitutional:      General: She is not in acute distress.  Appearance: She is well-developed.  HENT:     Head: Normocephalic and atraumatic.  Eyes:     Conjunctiva/sclera: Conjunctivae normal.  Cardiovascular:     Rate and Rhythm: Normal rate and regular rhythm.     Heart sounds: No murmur heard. Pulmonary:     Effort: Pulmonary effort is normal. No respiratory distress.     Breath sounds: Normal breath sounds.  Abdominal:     Palpations: Abdomen is soft.     Tenderness: There is abdominal tenderness in the right lower quadrant and suprapubic area.  Musculoskeletal:     Cervical back: Neck supple.  Skin:    General: Skin is warm and dry.  Neurological:     Mental Status: She is alert.    ED Results / Procedures / Treatments   Labs (all labs ordered are listed, but only abnormal  results are displayed) Labs Reviewed  OCCULT BLOOD X 1 CARD TO LAB, STOOL  POC OCCULT BLOOD, ED    EKG None  Radiology CT Angio Abd/Pel W and/or Wo Contrast  Result Date: 03/03/2021 CLINICAL DATA:  GI bleed, abdominal pain EXAM: CTA ABDOMEN AND PELVIS WITHOUT AND WITH CONTRAST TECHNIQUE: Multidetector CT imaging of the abdomen and pelvis was performed using the standard protocol during bolus administration of intravenous contrast. Multiplanar reconstructed images and MIPs were obtained and reviewed to evaluate the vascular anatomy. CONTRAST:  170mL OMNIPAQUE IOHEXOL 350 MG/ML SOLN COMPARISON:  None. FINDINGS: VASCULAR Aorta: Normal caliber aorta without aneurysm, dissection, vasculitis or significant stenosis. Celiac: Patent without evidence of aneurysm, dissection, vasculitis or significant stenosis. SMA: Patent without evidence of aneurysm, dissection, vasculitis or significant stenosis. Renals: Both renal arteries are patent without evidence of aneurysm, dissection, vasculitis, fibromuscular dysplasia or significant stenosis. IMA: Patent without evidence of aneurysm, dissection, vasculitis or significant stenosis. Inflow: Patent without evidence of aneurysm, dissection, vasculitis or significant stenosis. Proximal Outflow: Bilateral common femoral and visualized portions of the superficial and profunda femoral arteries are patent without evidence of aneurysm, dissection, vasculitis or significant stenosis. Veins: No obvious venous abnormality within the limitations of this arterial phase study. Review of the MIP images confirms the above findings. NON-VASCULAR Lower chest: No acute abnormality Hepatobiliary: No focal hepatic abnormality. Gallbladder unremarkable. Pancreas: No focal abnormality or ductal dilatation. Spleen: No focal abnormality.  Normal size. Adrenals/Urinary Tract: No adrenal abnormality. No focal renal abnormality. No stones or hydronephrosis. Urinary bladder is unremarkable.  Stomach/Bowel: No contrast extravasation to localize source of GI bleed. Stomach, large and small bowel grossly unremarkable. Lymphatic: No adenopathy Reproductive: Prior hysterectomy.  No adnexal masses. Other: No free fluid or free air. Musculoskeletal: No acute bony abnormality. IMPRESSION: VASCULAR No evidence of contrast extravasation to localize GI bleed. NON-VASCULAR No acute findings. Electronically Signed   By: Rolm Baptise M.D.   On: 03/03/2021 22:16    Procedures Procedures   Medications Ordered in ED Medications  ketorolac (TORADOL) 15 MG/ML injection 15 mg (has no administration in time range)  dicyclomine (BENTYL) capsule 10 mg (has no administration in time range)  pantoprazole (PROTONIX) injection 40 mg (has no administration in time range)  iohexol (OMNIPAQUE) 350 MG/ML injection 100 mL (100 mLs Intravenous Contrast Given 03/03/21 2147)    ED Course  I have reviewed the triage vital signs and the nursing notes.  Pertinent labs & imaging results that were available during my care of the patient were reviewed by me and considered in my medical decision making (see chart for details).    MDM Rules/Calculators/A&P  Patient seen emergency department for evaluation of abdominal pain and bright red blood per rectum.  Physical exam reveals tenderness in the right lower quadrant and over the suprapubic area.  Fecal occult negative.  CT angio abdomen pelvis unremarkable.  Patient symptoms controlled with pantoprazole, Toradol and Bentyl.  On reevaluation, patient's symptoms improved.  Patient able to tolerate p.o. in the emergency department without difficulty.  She was discharged with Bentyl and pantoprazole and instructions to follow-up with a local GI doctor.  Resources given for [employer] and [employer].  Also instructed to follow-up with her primary care physician.  Patient then discharged. Final Clinical Impression(s) / ED Diagnoses Final diagnoses:  None     Rx / DC Orders ED Discharge Orders     None        Ashlynd Michna, Debe Coder, MD 03/03/21 2321

## 2021-03-03 NOTE — Telephone Encounter (Signed)
Reason for Disposition  Blood in bowel movements  (Exception: Blood on surface of BM with constipation)  Answer Assessment - Initial Assessment Questions 1. LOCATION: "Where does it hurt?"      Below diaphragm to the right 2. RADIATION: "Does the pain shoot anywhere else?" (e.g., chest, back)     Center to right 3. ONSET: "When did the pain begin?" (e.g., minutes, hours or days ago)      2 weeks ago 4. SUDDEN: "Gradual or sudden onset?"     gradual 5. PATTERN "Does the pain come and go, or is it constant?"    - If constant: "Is it getting better, staying the same, or worsening?"      (Note: Constant means the pain never goes away completely; most serious pain is constant and it progresses)     - If intermittent: "How long does it last?" "Do you have pain now?"     (Note: Intermittent means the pain goes away completely between bouts)     Constant for 2 days 6. SEVERITY: "How bad is the pain?"  (e.g., Scale 1-10; mild, moderate, or severe)   - MILD (1-3): doesn't interfere with normal activities, abdomen soft and not tender to touch    - MODERATE (4-7): interferes with normal activities or awakens from sleep, abdomen tender to touch    - SEVERE (8-10): excruciating pain, doubled over, unable to do any normal activities      moderate 7. RECURRENT SYMPTOM: "Have you ever had this type of stomach pain before?" If Yes, ask: "When was the last time?" and "What happened that time?"      No- 2021 bleeding with BM- benign 8. CAUSE: "What do you think is causing the stomach pain?"     unsure 9. RELIEVING/AGGRAVATING FACTORS: "What makes it better or worse?" (e.g., movement, antacids, bowel movement)     Laying down on side helps 10. OTHER SYMPTOMS: "Do you have any other symptoms?" (e.g., back pain, diarrhea, fever, urination pain, vomiting)       Constipation- hard stool with bleed, loss of appetite, nausea 11. PREGNANCY: "Is there any chance you are pregnant?" "When was your last menstrual  period?"       na  Protocols used: Abdominal Pain - Female-A-AH, Abdominal Pain - Upper-A-AH

## 2021-03-03 NOTE — ED Notes (Signed)
POC Occult was put in error, correct occult blood order put in v.o. Kommor MD.

## 2021-03-03 NOTE — ED Triage Notes (Addendum)
States abd pain across abd  x 2-3 weeks getting worse , states had some rectal bleeding BRB with the poop since Friday, went to [place]  today states never seen  but had labs drawn

## 2021-03-03 NOTE — ED Notes (Signed)
Patient transported to CT 

## 2021-03-03 NOTE — ED Triage Notes (Signed)
C/O upper abdominal pain mid to right.  Nausea and stomach pain x 2-3 weeks ago.  Progressively worsening.  Thursday pain more constant and nausea.  Hx constipation. On Friday, after BM, wiped and saw blood.

## 2021-03-03 NOTE — Telephone Encounter (Signed)
Agree with advice give, pt currently in ED. Will review ED note once complete

## 2021-03-03 NOTE — ED Notes (Signed)
ED Provider at bedside. 

## 2021-04-21 ENCOUNTER — Ambulatory Visit (INDEPENDENT_AMBULATORY_CARE_PROVIDER_SITE_OTHER): Payer: 59 | Admitting: Internal Medicine

## 2021-04-21 ENCOUNTER — Other Ambulatory Visit: Payer: Self-pay

## 2021-04-21 ENCOUNTER — Encounter: Payer: Self-pay | Admitting: Internal Medicine

## 2021-04-21 VITALS — BP 119/84 | HR 73 | Temp 97.7°F | Resp 17 | Ht 65.0 in | Wt 146.4 lb

## 2021-04-21 DIAGNOSIS — F419 Anxiety disorder, unspecified: Secondary | ICD-10-CM

## 2021-04-21 DIAGNOSIS — F32A Depression, unspecified: Secondary | ICD-10-CM | POA: Diagnosis not present

## 2021-04-21 DIAGNOSIS — L509 Urticaria, unspecified: Secondary | ICD-10-CM | POA: Diagnosis not present

## 2021-04-21 DIAGNOSIS — K921 Melena: Secondary | ICD-10-CM | POA: Diagnosis not present

## 2021-04-21 MED ORDER — PREDNISONE 10 MG PO TABS: ORAL_TABLET | ORAL | 0 refills | Status: DC

## 2021-04-21 MED ORDER — VENLAFAXINE HCL ER 150 MG PO CP24: 150.0000 mg | ORAL_CAPSULE | Freq: Every day | ORAL | 2 refills | Status: DC

## 2021-04-21 NOTE — Progress Notes (Signed)
Subjective:    Patient ID: Jean Rios, female    DOB: [birth year], 45 y.o.   MRN: [id number]  HPI  Patient presents the clinic today with complaint of intermittent hives. She noticed this 4 months ago but reports it occurs daily. She reports the hives itch and burn. She had changed her body soap and thought this was a contributing factor, so she switched back to her normal soap but the hives do not improve. She denies changes in laundry soap. She was started on Venlafaxine 4 months ago and thinks this may be a contributing factor. She has tried Fexofenadine OTC with minimal relief of symptoms.   She also reports that she has been under a lot of stress this year secondary to grief of the loss of her brother her grandfather and her recent service dog.  She is taking Venlafaxine as prescribed and feels like it helps some but thinks she may benefit from a dose increase.  She is not currently seeing a therapist.  She has a history of depression but denies SI/HI.  She also requested referral to GI.  She was seen in the ER 10/30 for blood in her stool.  CT abdomen pelvis at that time was unremarkable.  She is taking Pantoprazole and Bentyl as needed.  She reports she has had similar symptoms in the past with subsequent colonoscopy in [place].  Review of Systems     Past Medical History:  Diagnosis Date   Anemia    Anxiety    Chronic back pain    Depression    Fibromyalgia    Frequent headaches    Hyperlipidemia    Kidney stone    Nerve damage    POTS (postural orthostatic tachycardia syndrome)    Stroke [facility])     Current Outpatient Medications  Medication Sig Dispense Refill   atorvastatin (LIPITOR) 10 MG tablet Take 1 tablet (10 mg total) by mouth daily. 90 tablet 3   dicyclomine (BENTYL) 20 MG tablet Take 1 tablet (20 mg total) by mouth 2 (two) times daily. 20 tablet 0   pantoprazole (PROTONIX) 20 MG tablet Take 1 tablet (20 mg total) by mouth daily. 30 tablet 0   venlafaxine  XR (EFFEXOR-XR) 75 MG 24 hr capsule TAKE 1 CAPSULE BY MOUTH DAILY WITH BREAKFAST. 90 capsule 1   No current facility-administered medications for this visit.    Allergies  Allergen Reactions   Cinnamon Itching    Throat gets irritated, and have difficulty to breath    No family history on file.  Social History   Socioeconomic History   Marital status: Married    Spouse name: Not on file   Number of children: Not on file   Years of education: Not on file   Highest education level: Not on file  Occupational History   Not on file  Tobacco Use   Smoking status: Former    Types: Cigarettes   Smokeless tobacco: Former    Types: Snuff  Substance and Sexual Activity   Alcohol use: Yes    Alcohol/week: 3.0 standard drinks    Types: 3 Standard drinks or equivalent per week   Drug use: Not Currently    Types: Marijuana, Methamphetamines   Sexual activity: Not on file  Other Topics Concern   Not on file  Social History Narrative   Not on file   Social Determinants of Health   Financial Resource Strain: Not on file  Food Insecurity: Not on file  Transportation Needs:  Not on file  Physical Activity: Not on file  Stress: Not on file  Social Connections: Not on file  Intimate Partner Violence: Not on file     Constitutional: Denies fever, malaise, fatigue, headache or abrupt weight changes.  HEENT: Denies eye pain, eye redness, ear pain, ringing in the ears, wax buildup, runny nose, nasal congestion, bloody nose, or sore throat. Respiratory: Denies difficulty breathing, shortness of breath, cough or sputum production.   Cardiovascular: Denies chest pain, chest tightness, palpitations or swelling in the hands or feet.  Gastrointestinal: Patient reports intermittent blood in stool.  Denies abdominal pain, bloating, constipation, diarrhea.  Skin: Denies redness, rashes, lesions or ulcercations.  Neurological: Denies dizziness, difficulty with memory, difficulty with speech or  problems with balance and coordination.  Psych: Patient has a history of anxiety and depression.  Denies SI/HI.  No other specific complaints in a complete review of systems (except as listed in HPI above).  Objective:   Physical Exam  BP 119/84 (BP Location: Right Arm, Patient Position: Sitting, Cuff Size: Normal)    Pulse 73    Temp 97.7 F (36.5 C) (Temporal)    Resp 17    Ht 5\' 5"  (1.651 m)    Wt 146 lb 6.4 oz (66.4 kg)    SpO2 100%    BMI 24.36 kg/m   Wt Readings from Last 3 Encounters:  03/03/21 141 lb (64 kg)  01/21/21 148 lb 12.8 oz (67.5 kg)    General: Appears her stated age, well developed, well nourished in NAD. Skin: Warm, dry and intact. No rashes, lesions or ulcerations noted. HEENT: Head: normal shape and size; Eyes: sclera white and EOMs intact;  Cardiovascular: Normal rate. Pulmonary/Chest: Normal effort and positive vesicular breath sounds. No respiratory distress. No wheezes, rales or ronchi noted.  Abdomen: Normal bowel sounds.  Neurological: Alert and oriented.  Psychiatric: Mood and affect normal.  Tearful.  Judgment and thought content normal.     BMET    Component Value Date/Time   NA 138 03/03/2021 1210   K 3.9 03/03/2021 1210   CL 102 03/03/2021 1210   CO2 29 03/03/2021 1210   GLUCOSE 98 03/03/2021 1210   BUN 13 03/03/2021 1210   CREATININE 0.98 03/03/2021 1210   CREATININE 0.89 02/04/2021 1138   CALCIUM 9.1 03/03/2021 1210   GFRNONAA >60 03/03/2021 1210    Lipid Panel     Component Value Date/Time   CHOL 251 (H) 01/21/2021 1456   TRIG 102 01/21/2021 1456   HDL 53 01/21/2021 1456   CHOLHDL 4.7 01/21/2021 1456   LDLCALC 176 (H) 01/21/2021 1456    CBC    Component Value Date/Time   WBC 5.1 03/03/2021 1210   RBC 4.53 03/03/2021 1210   HGB 14.3 03/03/2021 1210   HCT 40.9 03/03/2021 1210   PLT 270 03/03/2021 1210   MCV 90.3 03/03/2021 1210   MCH 31.6 03/03/2021 1210   MCHC 35.0 03/03/2021 1210   RDW 11.8 03/03/2021 1210    LYMPHSABS 2.4 03/02/2020 1828   MONOABS 0.7 03/02/2020 1828   EOSABS 0.3 03/02/2020 1828   BASOSABS 0.1 03/02/2020 1828    Hgb A1C No results found for: HGBA1C          Assessment & Plan:   Hives:  Could be related to increased stress Rx for Pred taper x6 days No improvement with antihistamines OTC Referral to allergist for further evaluation  Blood in Stool:  Currently not having this issue ER notes, labs and  imaging reviewed Referral to GI for further evaluation and treatment  Nicki Reaper, NP This visit occurred during the SARS-CoV-2 public health emergency.  Safety protocols were in place, including screening questions prior to the visit, additional usage of staff PPE, and extensive cleaning of exam room while observing appropriate contact time as indicated for disinfecting solutions.

## 2021-04-21 NOTE — Patient Instructions (Signed)

## 2021-04-21 NOTE — Assessment & Plan Note (Addendum)
Deteriorated I do not think Venlafaxine is causing her issue with hives Will increase dose 250 mg daily Referral to psychology placed for therapy Support offered

## 2021-04-29 ENCOUNTER — Encounter: Payer: Self-pay | Admitting: Internal Medicine

## 2021-04-29 NOTE — Progress Notes (Signed)
Could you compose a letter stating that this patient has no restrictions and released to her MyChart.  Please let her know when you have done so.  Thank you

## 2021-04-29 NOTE — Telephone Encounter (Signed)
Could you compose a letter stating that this patient has no restrictions and released to her MyChart.  Please let her know when you have done so.  Thank you

## 2021-05-01 ENCOUNTER — Encounter: Payer: Self-pay | Admitting: Internal Medicine

## 2021-05-08 ENCOUNTER — Encounter: Payer: Self-pay | Admitting: Internal Medicine

## 2021-05-15 ENCOUNTER — Other Ambulatory Visit: Payer: Self-pay | Admitting: Internal Medicine

## 2021-05-15 NOTE — Telephone Encounter (Signed)
Requested medication (s) are due for refill today:   Yes  Requested medication (s) are on the active medication list:   Yes  Future visit scheduled:   No.   Seen 3 wks ago   Last ordered: 04/21/2021 #30, 2 refills  Returned because pharmacy is requesting a 90 day supply.   Requested Prescriptions  Pending Prescriptions Disp Refills   venlafaxine XR (EFFEXOR-XR) 150 MG 24 hr capsule [Pharmacy Med Name: VENLAFAXINE HCL ER 150 MG CAP] 90 capsule 1    Sig: TAKE 1 CAPSULE BY MOUTH DAILY WITH BREAKFAST.     Psychiatry: Antidepressants - SNRI - desvenlafaxine & venlafaxine Failed - 05/15/2021 10:37 AM      Failed - LDL in normal range and within 360 days    LDL Cholesterol (Calc)  Date Value Ref Range Status  01/21/2021 176 (H) mg/dL (calc) Final    Comment:    Reference range: <100 . Desirable range <100 mg/dL for primary prevention;   <70 mg/dL for patients with CHD or diabetic patients  with > or = 2 CHD risk factors. Marland Kitchen LDL-C is now calculated using the Martin-Hopkins  calculation, which is a validated novel method providing  better accuracy than the Friedewald equation in the  estimation of LDL-C.  Horald Pollen et al. Lenox Ahr. 3491;791(50): 2061-2068  (http://education.[facility].com/faq/FAQ164)           Failed - Total Cholesterol in normal range and within 360 days    Cholesterol  Date Value Ref Range Status  01/21/2021 251 (H) <200 mg/dL Final          Passed - Triglycerides in normal range and within 360 days    Triglycerides  Date Value Ref Range Status  01/21/2021 102 <150 mg/dL Final          Passed - Completed PHQ-2 or PHQ-9 in the last 360 days      Passed - Last BP in normal range    BP Readings from Last 1 Encounters:  04/21/21 119/84          Passed - Valid encounter within last 6 months    Recent Outpatient Visits           3 weeks ago Hives   [facility] [place], Salvadore [place], NP   3 months ago Pure hypercholesterolemia   [facility] [place], Salvadore Oxford, [place]

## 2021-06-03 ENCOUNTER — Encounter: Payer: Self-pay | Admitting: Internal Medicine

## 2021-06-12 ENCOUNTER — Ambulatory Visit (INDEPENDENT_AMBULATORY_CARE_PROVIDER_SITE_OTHER): Payer: 59 | Admitting: Internal Medicine

## 2021-06-12 ENCOUNTER — Encounter: Payer: Self-pay | Admitting: Internal Medicine

## 2021-06-12 ENCOUNTER — Other Ambulatory Visit: Payer: Self-pay

## 2021-06-12 VITALS — BP 114/66 | HR 61 | Temp 97.3°F | Wt 146.0 lb

## 2021-06-12 DIAGNOSIS — R002 Palpitations: Secondary | ICD-10-CM

## 2021-06-12 DIAGNOSIS — E559 Vitamin D deficiency, unspecified: Secondary | ICD-10-CM

## 2021-06-12 DIAGNOSIS — R42 Dizziness and giddiness: Secondary | ICD-10-CM | POA: Diagnosis not present

## 2021-06-12 DIAGNOSIS — F32A Depression, unspecified: Secondary | ICD-10-CM

## 2021-06-12 DIAGNOSIS — R5383 Other fatigue: Secondary | ICD-10-CM | POA: Diagnosis not present

## 2021-06-12 DIAGNOSIS — F419 Anxiety disorder, unspecified: Secondary | ICD-10-CM

## 2021-06-12 DIAGNOSIS — R0789 Other chest pain: Secondary | ICD-10-CM

## 2021-06-12 DIAGNOSIS — R202 Paresthesia of skin: Secondary | ICD-10-CM

## 2021-06-12 LAB — VITAMIN B12: Vitamin B-12: 337 pg/mL (ref 200–1100)

## 2021-06-12 LAB — VITAMIN D 25 HYDROXY (VIT D DEFICIENCY, FRACTURES): Vit D, 25-Hydroxy: 27 ng/mL — ABNORMAL LOW (ref 30–100)

## 2021-06-12 LAB — TSH: TSH: 3.78 mIU/L

## 2021-06-12 MED ORDER — SERTRALINE HCL 50 MG PO TABS: 50.0000 mg | ORAL_TABLET | Freq: Every day | ORAL | 2 refills | Status: DC

## 2021-06-12 NOTE — Progress Notes (Signed)
° °  Subjective:    Patient ID: Jean Rios, female    DOB: 02-11-1976, 46 y.o.   MRN: [id number]  HPI    Review of Systems     Objective:   Physical Exam        Assessment & Plan:

## 2021-06-12 NOTE — Progress Notes (Signed)
Subjective:    Patient ID: Jean Rios, female    DOB: [birth year], 46 y.o.   MRN: [id number]  HPI  Patient presents to clinic today with complaint of lightheadedness/foggy headed, palpitations, intermittent chest pains and tingling in her right hand.  She noticed this 04/2021 but worsened in the beginning of January.  She has also been very emotional, anxious and feels depressed at times. She is taking Venlafaxine to 150 mg daily 04/2021 but has not noticed any improvement in her symptoms. She has taken Escitalopram in the past and reports it seemed to just stop working. She has had a lot of stress over the last year. Her BP today is 114/66, it usually runs 130/80 at home. There is no ECG on file.  Review of Systems     Past Medical History:  Diagnosis Date   Anemia    Anxiety    Chronic back pain    Depression    Fibromyalgia    Frequent headaches    Hyperlipidemia    Kidney stone    Nerve damage    POTS (postural orthostatic tachycardia syndrome)    Stroke [facility])     Current Outpatient Medications  Medication Sig Dispense Refill   atorvastatin (LIPITOR) 10 MG tablet Take 1 tablet (10 mg total) by mouth daily. 90 tablet 3   pantoprazole (PROTONIX) 20 MG tablet Take 1 tablet (20 mg total) by mouth daily. 30 tablet 0   predniSONE (DELTASONE) 10 MG tablet Take 6 tabs on day 1, 5 tabs on day 2, 4 tabs on day 3, 3 tabs on day 4, 2 tabs on day 5, 1 tab on day 6 21 tablet 0   venlafaxine XR (EFFEXOR-XR) 150 MG 24 hr capsule TAKE 1 CAPSULE BY MOUTH DAILY WITH BREAKFAST. 90 capsule 1   No current facility-administered medications for this visit.    Allergies  Allergen Reactions   Cinnamon Itching    Throat gets irritated, and have difficulty to breath    No family history on file.  Social History   Socioeconomic History   Marital status: Married    Spouse name: Not on file   Number of children: Not on file   Years of education: Not on file   Highest education  level: Not on file  Occupational History   Not on file  Tobacco Use   Smoking status: Former    Types: Cigarettes   Smokeless tobacco: Former    Types: Snuff  Substance and Sexual Activity   Alcohol use: Yes    Alcohol/week: 3.0 standard drinks    Types: 3 Standard drinks or equivalent per week   Drug use: Not Currently    Types: Marijuana, Methamphetamines   Sexual activity: Not on file  Other Topics Concern   Not on file  Social History Narrative   Not on file   Social Determinants of Health   Financial Resource Strain: Not on file  Food Insecurity: Not on file  Transportation Needs: Not on file  Physical Activity: Not on file  Stress: Not on file  Social Connections: Not on file  Intimate Partner Violence: Not on file     Constitutional: Denies fever, malaise, fatigue, headache or abrupt weight changes.  Respiratory: Denies difficulty breathing, shortness of breath, cough or sputum production.   Cardiovascular: Patient reports palpitations and chest pain.  Denies chest tightness, or swelling in the hands or feet.  Gastrointestinal: Denies abdominal pain, bloating, constipation, diarrhea or blood in the stool.  Neurological:  Patient reports numbness in left arm.  Denies dizziness, difficulty with memory, difficulty with speech or problems with balance and coordination.  Psych: Patient reports anxiety and depression.  Denies SI/HI.  No other specific complaints in a complete review of systems (except as listed in HPI above).  Objective:   Physical Exam BP 114/66 (BP Location: Left Arm, Patient Position: Sitting, Cuff Size: Normal)    Pulse 61    Temp (!) 97.3 F (36.3 C) (Temporal)    Wt 146 lb (66.2 kg)    SpO2 100%    BMI 24.30 kg/m   Wt Readings from Last 3 Encounters:  04/21/21 146 lb 6.4 oz (66.4 kg)  03/03/21 141 lb (64 kg)  01/21/21 148 lb 12.8 oz (67.5 kg)    General: Appears her stated age, well developed, well nourished in NAD. Skin: Warm, dry and  intact.  HEENT: Head: normal shape and size; Eyes: PERRLA and EOMs intact;  Neck:  Neck supple, trachea midline. No masses, lumps or thyromegaly present.  Cardiovascular: Normal rate and rhythm. S1,S2 noted.  No murmur, rubs or gallops noted. No JVD or BLE edema. No carotid bruits noted. Pulmonary/Chest: Normal effort and positive vesicular breath sounds. No respiratory distress. No wheezes, rales or ronchi noted.  Musculoskeletal: Normal flexion flexion, extension and rotation of the cervical spine.  Normal internal and external rotation of the right shoulder.  Strength 5/5 BUE/BLE.  No difficulty with gait.  Neurological: Alert and oriented.  Positive Phalen's.  Positive Tinel's.  Coordination normal.  Psychiatric: Tearful.  Anxious appearing.  Judgment and thought content normal.    BMET    Component Value Date/Time   NA 138 03/03/2021 1210   K 3.9 03/03/2021 1210   CL 102 03/03/2021 1210   CO2 29 03/03/2021 1210   GLUCOSE 98 03/03/2021 1210   BUN 13 03/03/2021 1210   CREATININE 0.98 03/03/2021 1210   CREATININE 0.89 02/04/2021 1138   CALCIUM 9.1 03/03/2021 1210   GFRNONAA >60 03/03/2021 1210    Lipid Panel     Component Value Date/Time   CHOL 251 (H) 01/21/2021 1456   TRIG 102 01/21/2021 1456   HDL 53 01/21/2021 1456   CHOLHDL 4.7 01/21/2021 1456   LDLCALC 176 (H) 01/21/2021 1456    CBC    Component Value Date/Time   WBC 5.1 03/03/2021 1210   RBC 4.53 03/03/2021 1210   HGB 14.3 03/03/2021 1210   HCT 40.9 03/03/2021 1210   PLT 270 03/03/2021 1210   MCV 90.3 03/03/2021 1210   MCH 31.6 03/03/2021 1210   MCHC 35.0 03/03/2021 1210   RDW 11.8 03/03/2021 1210   LYMPHSABS 2.4 03/02/2020 1828   MONOABS 0.7 03/02/2020 1828   EOSABS 0.3 03/02/2020 1828   BASOSABS 0.1 03/02/2020 1828    Hgb A1C No results found for: HGBA1C           Assessment & Plan:  Lightheadedness, Palpitations, Chest Pain, Paresthesia of RUE:  Indication for ECG: Palpitations and chest  pain Interpretation of ECG: Bradycardic with short PR interval Comparison of ECG: We will check TSH, vitamin D and B12 today  RTC in 1 month for your annual exam, follow-up of anxiety and depression  Webb Silversmith, NP This visit occurred during the SARS-CoV-2 public health emergency.  Safety protocols were in place, including screening questions prior to the visit, additional usage of staff PPE, and extensive cleaning of exam room while observing appropriate contact time as indicated for disinfecting solutions.

## 2021-06-12 NOTE — Assessment & Plan Note (Signed)
Deteriorated Vaccine does not seem to be effective, will D/C Rx for Sertraline 50 mg daily Support offered

## 2021-06-12 NOTE — Patient Instructions (Signed)

## 2021-06-14 ENCOUNTER — Encounter: Payer: Self-pay | Admitting: Internal Medicine

## 2021-06-23 ENCOUNTER — Other Ambulatory Visit: Payer: Self-pay

## 2021-06-23 ENCOUNTER — Encounter: Payer: Self-pay | Admitting: Gastroenterology

## 2021-06-23 ENCOUNTER — Ambulatory Visit (INDEPENDENT_AMBULATORY_CARE_PROVIDER_SITE_OTHER): Payer: 59 | Admitting: Gastroenterology

## 2021-06-23 VITALS — BP 118/73 | HR 80 | Temp 98.3°F | Ht 65.0 in | Wt 147.2 lb

## 2021-06-23 DIAGNOSIS — Z791 Long term (current) use of non-steroidal anti-inflammatories (NSAID): Secondary | ICD-10-CM

## 2021-06-23 DIAGNOSIS — J309 Allergic rhinitis, unspecified: Secondary | ICD-10-CM | POA: Insufficient documentation

## 2021-06-23 DIAGNOSIS — K921 Melena: Secondary | ICD-10-CM

## 2021-06-23 DIAGNOSIS — R1013 Epigastric pain: Secondary | ICD-10-CM

## 2021-06-23 DIAGNOSIS — L501 Idiopathic urticaria: Secondary | ICD-10-CM | POA: Insufficient documentation

## 2021-06-23 MED ORDER — NA SULFATE-K SULFATE-MG SULF 17.5-3.13-1.6 GM/177ML PO SOLN: 354.0000 mL | Freq: Once | ORAL | 0 refills | Status: AC

## 2021-06-23 MED ORDER — OMEPRAZOLE 40 MG PO CPDR: 40.0000 mg | DELAYED_RELEASE_CAPSULE | Freq: Every day | ORAL | 3 refills | Status: DC

## 2021-06-23 NOTE — Progress Notes (Signed)
Jean Mood MD, MRCP(U.K) [address]  [address]  [place], [place] [address]  Main: [phone]  Fax: [phone]   Gastroenterology Consultation  Referring Provider:     Lorre Munroe, NP Primary Care Physician:  Lorre Munroe, NP Primary Gastroenterologist:  Dr. Wyline Rios  Reason for Consultation:     Blood in stool         HPI:   Jean Rios is a 46 y.o. y/o female referred for consultation & management  by Lorre Munroe, NP.   She states that back in 2021 she had rectal bleeding that was painless associated with bowel movements when she was in [place] and had a colonoscopy and was told nothing was wrong.  She was not clearly told if she did or did not have hemorrhoids.  Everything was fine for about 2 years then over the past few weeks she has had a few episodes of bright red blood per rectum, she had pictures of it in her phone which showed significant amount of blood with clots.  Has had issues on and off with constipation.  Some perianal itching.  Family history of colon cancer.  She had been taking Motrin on a regular basis few times a day for many years.  Stopped it a few weeks back when she started developing epigastric discomfort worse with eating radiating to the back between her shoulder blades sharp in nature.  Not on any PPI.  October 2022 hemoglobin normal  Past Medical History:  Diagnosis Date   Anemia    Anxiety    Chronic back pain    Depression    Fibromyalgia    Frequent headaches    Hyperlipidemia    Kidney stone    Nerve damage    POTS (postural orthostatic tachycardia syndrome)    Stroke [facility])     Past Surgical History:  Procedure Laterality Date   PARTIAL HYSTERECTOMY  2014    Prior to Admission medications   Medication Sig Start Date End Date Taking? Authorizing Provider  atorvastatin (LIPITOR) 10 MG tablet Take 1 tablet (10 mg total) by mouth daily. 01/22/21   Lorre Munroe, NP  hydrOXYzine (ATARAX) 25 MG tablet Take  25 mg by mouth every 8 (eight) hours. 06/05/21   [provider]  levocetirizine (XYZAL) 5 MG tablet SMARTSIG:1 Tablet(s) By Mouth Every Evening PRN 06/05/21   [provider]  mometasone (ELOCON) 0.1 % cream Apply 1 application topically 2 (two) times daily. 06/05/21   [provider]  pantoprazole (PROTONIX) 20 MG tablet Take 1 tablet (20 mg total) by mouth daily. 03/03/21 04/02/21  Kommor, [place], MD  sertraline (ZOLOFT) 50 MG tablet Take 1 tablet (50 mg total) by mouth daily. 06/12/21   Lorre Munroe, NP    No family history on file.   Social History   Tobacco Use   Smoking status: Former    Types: Cigarettes   Smokeless tobacco: Former    Types: Snuff  Substance Use Topics   Alcohol use: Yes    Alcohol/week: 3.0 standard drinks    Types: 3 Standard drinks or equivalent per week   Drug use: Not Currently    Types: Marijuana, Methamphetamines    Allergies as of 06/23/2021 - Review Complete 06/23/2021  Allergen Reaction Noted   Dust mite extract Hives and Itching 06/05/2021   Mixed feathers Itching 06/05/2021    Review of Systems:    All systems reviewed and negative except where noted in  HPI.   Physical Exam:  BP 118/73    Pulse 80    Temp 98.3 F (36.8 C) (Oral)    Ht 5\' 5"  (1.651 m)    Wt 147 lb 3.2 oz (66.8 kg)    BMI 24.50 kg/m  No LMP recorded. Psych:  Alert and cooperative. Normal Rios and affect. General:   Alert,  Well-developed, well-nourished, pleasant and cooperative in NAD Head:  Normocephalic and atraumatic. Eyes:  Sclera clear, no icterus.   Conjunctiva pink. Ears:  Normal auditory acuity.. Lungs:  Respirations even and unlabored.  Clear throughout to auscultation.   No wheezes, crackles, or rhonchi. No acute distress. Heart:  Regular rate and rhythm; no murmurs, clicks, rubs, or gallops. Abdomen:  Normal bowel sounds.  No bruits.  Soft, non-tender and non-distended without masses, hepatosplenomegaly or hernias noted.  No guarding or  rebound tenderness.    Neurologic:  Alert and oriented x3;  grossly normal neurologically. Psych:  Alert and cooperative. Normal Rios and affect.  Imaging Studies: No results found.  Assessment and Plan:   Jean Rios is a 46 y.o. y/o female has been referred for blood in stool.  Significant amount of blood seen in the toilet bowl with pictures she had on her phone.  Differentials includes hemorrhoids which is most likely.  She also has dyspeptic symptoms after being on NSAIDs for many years.  Concern for gastric ulcer   Plan  Colonoscopy and EGD at the same time to evaluate rectal bleeding as well as dyspeptic symptoms.  If hemorrhoids are found we will see her back in the office for banding which I discussed with her at she has had significant blood loss High-fiber diet patient information provided conservative management of internal hemorrhoids discussed about perianal hygiene and toileting.  Patient information provided.  High-fiber diet and a trial of Prilosec 40 mg once a day to be taken 30 minutes before breakfast   I have discussed alternative options, risks & benefits,  which include, but are not limited to, bleeding, infection, perforation,respiratory complication & drug reaction.  The patient agrees with this plan & written consent will be obtained.     Follow up in 1 to 2 weeks after her EGD and colonoscopy  Dr Jonathon Bellows MD,MRCP(U.K)

## 2021-06-23 NOTE — Patient Instructions (Signed)
High-Fiber Eating Plan °Fiber, also called dietary fiber, is a type of carbohydrate. It is found foods such as fruits, vegetables, whole grains, and beans. A high-fiber diet can have many health benefits. Your health care provider may recommend a high-fiber diet to help: °Prevent constipation. Fiber can make your bowel movements more regular. °Lower your cholesterol. °Relieve the following conditions: °Inflammation of veins in the anus (hemorrhoids). °Inflammation of specific areas of the digestive tract (uncomplicated diverticulosis). °A problem of the large intestine, also called the colon, that sometimes causes pain and diarrhea (irritable bowel syndrome, or IBS). °Prevent overeating as part of a weight-loss plan. °Prevent heart disease, type 2 diabetes, and certain cancers. °What are tips for following this plan? °Reading food labels ° °Check the nutrition facts label on food products for the amount of dietary fiber. Choose foods that have 5 grams of fiber or more per serving. °The goals for recommended daily fiber intake include: °Men (age 50 or younger): 34-38 g. °Men (over age 50): 28-34 g. °Women (age 50 or younger): 25-28 g. °Women (over age 50): 22-25 g. °Your daily fiber goal is _____________ g. °Shopping °Choose whole fruits and vegetables instead of processed forms, such as apple juice or applesauce. °Choose a wide variety of high-fiber foods such as avocados, lentils, oats, and kidney beans. °Read the nutrition facts label of the foods you choose. Be aware of foods with added fiber. These foods often have high sugar and sodium amounts per serving. °Cooking °Use whole-grain flour for baking and cooking. °Cook with brown rice instead of white rice. °Meal planning °Start the day with a breakfast that is high in fiber, such as a cereal that contains 5 g of fiber or more per serving. °Eat breads and cereals that are made with whole-grain flour instead of refined flour or white flour. °Eat brown rice, bulgur  wheat, or millet instead of white rice. °Use beans in place of meat in soups, salads, and pasta dishes. °Be sure that half of the grains you eat each day are whole grains. °General information °You can get the recommended daily intake of dietary fiber by: °Eating a variety of fruits, vegetables, grains, nuts, and beans. °Taking a fiber supplement if you are not able to take in enough fiber in your diet. It is better to get fiber through food than from a supplement. °Gradually increase how much fiber you consume. If you increase your intake of dietary fiber too quickly, you may have bloating, cramping, or gas. °Drink plenty of water to help you digest fiber. °Choose high-fiber snacks, such as berries, raw vegetables, nuts, and popcorn. °What foods should I eat? °Fruits °Berries. Pears. Apples. Oranges. Avocado. Prunes and raisins. Dried figs. °Vegetables °Sweet potatoes. Spinach. Kale. Artichokes. Cabbage. Broccoli. Cauliflower. Green peas. Carrots. Squash. °Grains °Whole-grain breads. Multigrain cereal. Oats and oatmeal. Brown rice. Barley. Bulgur wheat. Millet. Quinoa. Bran muffins. Popcorn. Rye wafer crackers. °Meats and other proteins °Navy beans, kidney beans, and pinto beans. Soybeans. Split peas. Lentils. Nuts and seeds. °Dairy °Fiber-fortified yogurt. °Beverages °Fiber-fortified soy milk. Fiber-fortified orange juice. °Other foods °Fiber bars. °The items listed above may not be a complete list of recommended foods and beverages. Contact a dietitian for more information. °What foods should I avoid? °Fruits °Fruit juice. Cooked, strained fruit. °Vegetables °Fried potatoes. Canned vegetables. Well-cooked vegetables. °Grains °White bread. Pasta made with refined flour. White rice. °Meats and other proteins °Fatty cuts of meat. Fried chicken or fried fish. °Dairy °Milk. Yogurt. Cream cheese. Sour cream. °Fats and   oils °Butters. °Beverages °Soft drinks. °Other foods °Cakes and pastries. °The items listed above may  not be a complete list of foods and beverages to avoid. Talk with your dietitian about what choices are best for you. °Summary °Fiber is a type of carbohydrate. It is found in foods such as fruits, vegetables, whole grains, and beans. °A high-fiber diet has many benefits. It can help to prevent constipation, lower blood cholesterol, aid weight loss, and reduce your risk of heart disease, diabetes, and certain cancers. °Increase your intake of fiber gradually. Increasing fiber too quickly may cause cramping, bloating, and gas. Drink plenty of water while you increase the amount of fiber you consume. °The best sources of fiber include whole fruits and vegetables, whole grains, nuts, seeds, and beans. °This information is not intended to replace advice given to you by your health care provider. Make sure you discuss any questions you have with your health care provider. °Document Revised: 08/24/2019 Document Reviewed: 08/24/2019 °Elsevier Patient Education © 2022 [employer] ° °Hemorrhoids °Hemorrhoids are swollen veins that may develop: °In the butt (rectum). These are called internal hemorrhoids. °Around the opening of the butt (anus). These are called external hemorrhoids. °Hemorrhoids can cause pain, itching, or bleeding. Most of the time, they do not cause serious problems. They usually get better with diet changes, lifestyle changes, and other home treatments. °What are the causes? °This condition may be caused by: °Having trouble pooping (constipation). °Pushing hard (straining) to poop. °Watery poop (diarrhea). °Pregnancy. °Being very overweight (obese). °Sitting for long periods of time. °Heavy lifting or other activity that causes you to strain. °Anal sex. °Riding a bike for a long period of time. °What are the signs or symptoms? °Symptoms of this condition include: °Pain. °Itching or soreness in the butt. °Bleeding from the butt. °Leaking poop. °Swelling in the area. °One or more lumps around the opening  of your butt. °How is this diagnosed? °A doctor can often diagnose this condition by looking at the affected area. The doctor may also: °Do an exam that involves feeling the area with a gloved hand (digital rectal exam). °Examine the area inside your butt using a small tube (anoscope). °Order blood tests. This may be done if you have lost a lot of blood. °Have you get a test that involves looking inside the colon using a flexible tube with a camera on the end (sigmoidoscopy or colonoscopy). °How is this treated? °This condition can usually be treated at home. Your doctor may tell you to change what you eat, make lifestyle changes, or try home treatments. If these do not help, procedures can be done to remove the hemorrhoids or make them smaller. These may involve: °Placing rubber bands at the base of the hemorrhoids to cut off their blood supply. °Injecting medicine into the hemorrhoids to shrink them. °Shining a type of light energy onto the hemorrhoids to cause them to fall off. °Doing surgery to remove the hemorrhoids or cut off their blood supply. °Follow these instructions at home: °Eating and drinking ° °Eat foods that have a lot of fiber in them. These include whole grains, beans, nuts, fruits, and vegetables. °Ask your doctor about taking products that have added fiber (fibersupplements). °Reduce the amount of fat in your diet. You can do this by: °Eating low-fat dairy products. °Eating less red meat. °Avoiding processed foods. °Drink enough fluid to keep your pee (urine) pale yellow. °Managing pain and swelling ° °Take a warm-water bath (sitz bath) for 20 minutes to   ease pain. Do this 3-4 times a day. You may do this in a bathtub or using a portable sitz bath that fits over the toilet. °If told, put ice on the painful area. It may be helpful to use ice between your warm baths. °Put ice in a plastic bag. °Place a towel between your skin and the bag. °Leave the ice on for 20 minutes, 2-3 times a day. °General  instructions °Take over-the-counter and prescription medicines only as told by your doctor. °Medicated creams and medicines may be used as told. °Exercise often. Ask your doctor how much and what kind of exercise is best for you. °Go to the bathroom when you have the urge to poop. Do not wait. °Avoid pushing too hard when you poop. °Keep your butt dry and clean. Use wet toilet paper or moist towelettes after pooping. °Do not sit on the toilet for a long time. °Keep all follow-up visits as told by your doctor. This is important. °Contact a doctor if you: °Have pain and swelling that do not get better with treatment or medicine. °Have trouble pooping. °Cannot poop. °Have pain or swelling outside the area of the hemorrhoids. °Get help right away if you have: °Bleeding that will not stop. °Summary °Hemorrhoids are swollen veins in the butt or around the opening of the butt. °They can cause pain, itching, or bleeding. °Eat foods that have a lot of fiber in them. These include whole grains, beans, nuts, fruits, and vegetables. °Take a warm-water bath (sitz bath) for 20 minutes to ease pain. Do this 3-4 times a day. °This information is not intended to replace advice given to you by your health care provider. Make sure you discuss any questions you have with your health care provider. °Document Revised: 10/30/2020 Document Reviewed: 10/30/2020 °Elsevier Patient Education © 2022 [employer] Inc[employer] ° °

## 2021-06-23 NOTE — Addendum Note (Signed)
Addended by: Adela Ports on: 06/23/2021 02:26 PM   Modules accepted: Orders

## 2021-06-24 LAB — H. PYLORI BREATH TEST: H pylori Breath Test: NEGATIVE

## 2021-06-30 ENCOUNTER — Encounter: Payer: Self-pay | Admitting: Internal Medicine

## 2021-07-07 ENCOUNTER — Ambulatory Visit (INDEPENDENT_AMBULATORY_CARE_PROVIDER_SITE_OTHER): Payer: 59 | Admitting: Internal Medicine

## 2021-07-07 ENCOUNTER — Other Ambulatory Visit: Payer: Self-pay

## 2021-07-07 ENCOUNTER — Encounter: Payer: Self-pay | Admitting: Internal Medicine

## 2021-07-07 VITALS — BP 120/86 | HR 65 | Temp 97.1°F | Ht 65.0 in | Wt 147.0 lb

## 2021-07-07 DIAGNOSIS — Z1159 Encounter for screening for other viral diseases: Secondary | ICD-10-CM

## 2021-07-07 DIAGNOSIS — E7800 Pure hypercholesterolemia, unspecified: Secondary | ICD-10-CM

## 2021-07-07 DIAGNOSIS — F419 Anxiety disorder, unspecified: Secondary | ICD-10-CM

## 2021-07-07 DIAGNOSIS — Z1231 Encounter for screening mammogram for malignant neoplasm of breast: Secondary | ICD-10-CM | POA: Diagnosis not present

## 2021-07-07 DIAGNOSIS — F32A Depression, unspecified: Secondary | ICD-10-CM

## 2021-07-07 DIAGNOSIS — Z114 Encounter for screening for human immunodeficiency virus [HIV]: Secondary | ICD-10-CM | POA: Diagnosis not present

## 2021-07-07 DIAGNOSIS — Z0001 Encounter for general adult medical examination with abnormal findings: Secondary | ICD-10-CM

## 2021-07-07 DIAGNOSIS — E559 Vitamin D deficiency, unspecified: Secondary | ICD-10-CM

## 2021-07-07 NOTE — Assessment & Plan Note (Signed)
Improved on Sertraline 100 mg ?Support offered ?We will continue to monitor ?

## 2021-07-07 NOTE — Progress Notes (Signed)
? ?Subjective:  ? ? Patient ID: Jean Rios, female    DOB: July 09, 1975, 46 y.o.   MRN: [id number] ? ?HPI ? ?Patient presents to clinic today for her annual exam.  She is also due to follow-up anxiety and depression. We recently went up to 100 mg on her Sertraline. She does report she is feeling better most days. She is still having GI issues, but is working with GI on this. ? ?Flu: 06/2021 ?Tetanus: unsure ?COVID: Moderna x1 ?Pap smear: 2019, in [place] ?Mammogram: > 2 years ?Vision screening: as needed ?Dentist: as needed ? ?Diet: She does eat lean meat. She consumes fruits and veggies. She tries to avoid fried foods. She drinks mostly water, sugar free soda. ?Exercise: None ? ? ?Review of Systems ? ?Past Medical History:  ?Diagnosis Date  ? Anemia   ? Anxiety   ? Chronic back pain   ? Depression   ? Fibromyalgia   ? Frequent headaches   ? Hyperlipidemia   ? Kidney stone   ? Nerve damage   ? POTS (postural orthostatic tachycardia syndrome)   ? Stroke [facility])   ? ? ?Current Outpatient Medications  ?Medication Sig Dispense Refill  ? atorvastatin (LIPITOR) 10 MG tablet Take 1 tablet (10 mg total) by mouth daily. 90 tablet 3  ? hydrOXYzine (ATARAX) 25 MG tablet Take 25 mg by mouth every 8 (eight) hours.    ? levocetirizine (XYZAL) 5 MG tablet SMARTSIG:1 Tablet(s) By Mouth Every Evening PRN    ? mometasone (ELOCON) 0.1 % cream     ? omeprazole (PRILOSEC) 40 MG capsule Take 1 capsule (40 mg total) by mouth 2 (two) times daily. 180 capsule 1  ? predniSONE (STERAPRED UNI-PAK 21 TAB) 10 MG (21) TBPK tablet     ? sertraline (ZOLOFT) 100 MG tablet Take 1 tablet (100 mg total) by mouth daily. 90 tablet 1  ? ?No current facility-administered medications for this visit.  ? ? ?Allergies  ?Allergen Reactions  ? Dust Mite Extract Hives and Itching  ? Mixed Feathers Itching  ? ? ?No family history on file. ? ?Social History  ? ?Socioeconomic History  ? Marital status: Married  ?  Spouse name: Not on file  ? Number of children:  Not on file  ? Years of education: Not on file  ? Highest education level: Not on file  ?Occupational History  ? Not on file  ?Tobacco Use  ? Smoking status: Former  ?  Types: Cigarettes  ? Smokeless tobacco: Former  ?  Types: Snuff  ?Substance and Sexual Activity  ? Alcohol use: Yes  ?  Alcohol/week: 3.0 standard drinks  ?  Types: 3 Standard drinks or equivalent per week  ? Drug use: Not Currently  ?  Types: Marijuana, Methamphetamines  ? Sexual activity: Not on file  ?Other Topics Concern  ? Not on file  ?Social History Narrative  ? Not on file  ? ?Social Determinants of Health  ? ?Financial Resource Strain: Not on file  ?Food Insecurity: Not on file  ?Transportation Needs: Not on file  ?Physical Activity: Not on file  ?Stress: Not on file  ?Social Connections: Not on file  ?Intimate Partner Violence: Not on file  ? ? ? ?Constitutional: Patient reports intermittent headaches.  Denies fever, malaise, fatigue, or abrupt weight changes.  ?HEENT: Denies eye pain, eye redness, ear pain, ringing in the ears, wax buildup, runny nose, nasal congestion, bloody nose, or sore throat. ?Respiratory: Denies difficulty breathing, shortness of breath, cough  or sputum production.   ?Cardiovascular: Denies chest pain, chest tightness, palpitations or swelling in the hands or feet.  ?Gastrointestinal: Pt reports epigastric pain, bloating and blood in her stool. Denies constipation, diarrhea.  ?GU: Denies urgency, frequency, pain with urination, burning sensation, blood in urine, odor or discharge. ?Musculoskeletal: Patient reports chronic muscle and joint pain.  Denies decrease in range of motion, difficulty with gait, or joint welling.  ?Skin: Denies redness, rashes, lesions or ulcercations.  ?Neurological: Patient reports insomnia.  Denies dizziness, difficulty with memory, difficulty with speech or problems with balance and coordination.  ?Psych: Patient has history of anxiety and depression.  Denies SI/HI. ? ?No other specific  complaints in a complete review of systems (except as listed in HPI above). ? ?   ?Objective:  ? Physical Exam ? ? ?BP 120/86 (BP Location: Left Arm, Patient Position: Sitting, Cuff Size: Normal)   Pulse 65   Temp (!) 97.1 ?F (36.2 ?C) (Temporal)   Ht 5\' 5"  (1.651 m)   Wt 147 lb (66.7 kg)   SpO2 100%   BMI 24.46 kg/m?  ?Wt Readings from Last 3 Encounters:  ?06/23/21 147 lb 3.2 oz (66.8 kg)  ?06/12/21 146 lb (66.2 kg)  ?04/21/21 146 lb 6.4 oz (66.4 kg)  ? ? ?General: Appears her stated age, well developed, well nourished in NAD. ?Skin: Warm, dry and intact.  ?HEENT: Head: normal shape and size; Eyes: sclera white and EOMs intact;  ?Neck:  Neck supple, trachea midline. No masses, lumps or thyromegaly present.  ?Cardiovascular: Normal rate and rhythm. S1,S2 noted.  No murmur, rubs or gallops noted. No JVD or BLE edema.  ?Pulmonary/Chest: Normal effort and positive vesicular breath sounds. No respiratory distress. No wheezes, rales or ronchi noted.  ?Abdomen: Soft and generally tender. Normal bowel sounds.  ?Musculoskeletal: Strength 5/5 BUE/BLE.  No difficulty with gait.  ?Neurological: Alert and oriented. Cranial nerves II-XII grossly intact. Coordination normal.  ?Psychiatric: Mood and affect mildly flat. Behavior is normal. Judgment and thought content normal.  ? ? ?BMET ?   ?Component Value Date/Time  ? NA 138 03/03/2021 1210  ? K 3.9 03/03/2021 1210  ? CL 102 03/03/2021 1210  ? CO2 29 03/03/2021 1210  ? GLUCOSE 98 03/03/2021 1210  ? BUN 13 03/03/2021 1210  ? CREATININE 0.98 03/03/2021 1210  ? CREATININE 0.89 02/04/2021 1138  ? CALCIUM 9.1 03/03/2021 1210  ? GFRNONAA >60 03/03/2021 1210  ? ? ?Lipid Panel  ?   ?Component Value Date/Time  ? CHOL 251 (H) 01/21/2021 1456  ? TRIG 102 01/21/2021 1456  ? HDL 53 01/21/2021 1456  ? CHOLHDL 4.7 01/21/2021 1456  ? LDLCALC 176 (H) 01/21/2021 1456  ? ? ?CBC ?   ?Component Value Date/Time  ? WBC 5.1 03/03/2021 1210  ? RBC 4.53 03/03/2021 1210  ? HGB 14.3 03/03/2021 1210  ?  HCT 40.9 03/03/2021 1210  ? PLT 270 03/03/2021 1210  ? MCV 90.3 03/03/2021 1210  ? MCH 31.6 03/03/2021 1210  ? MCHC 35.0 03/03/2021 1210  ? RDW 11.8 03/03/2021 1210  ? LYMPHSABS 2.4 03/02/2020 1828  ? MONOABS 0.7 03/02/2020 1828  ? EOSABS 0.3 03/02/2020 1828  ? BASOSABS 0.1 03/02/2020 1828  ? ? ?Hgb A1C ?No results found for: HGBA1C ? ? ? ? ?   ?Assessment & Plan:  ? ?Preventative Health Maintenance: ? ?Flu shot UTD ?Tdap today ?Encouraged her to get her covid booster ?Pap smear UTD per her report, will request copy of this ?Mammogram ordered,  she will call to schedule ?She has an upper GI and colonoscopy scheduled for Thursday ?Encouraged her to consume a balanced diet and exercise regimen ?Advised her to see an eye doctor and dentist annually ?Will check CBC, CMET, Lipid, A1c, HIV and Hep C today ? ?RTC in 6 months, follow up chronic conditions ? ?Nicki Reaper, NP ?This visit occurred during the SARS-CoV-2 public health emergency.  Safety protocols were in place, including screening questions prior to the visit, additional usage of staff PPE, and extensive cleaning of exam room while observing appropriate contact time as indicated for disinfecting solutions.  ? ?

## 2021-07-07 NOTE — Patient Instructions (Signed)

## 2021-07-08 LAB — HEMOGLOBIN A1C
Hgb A1c MFr Bld: 5.2 % of total Hgb (ref <5.7)
Mean Plasma Glucose: 103 mg/dL
eAG (mmol/L): 5.7 mmol/L

## 2021-07-08 LAB — COMPLETE METABOLIC PANEL WITH GFR
AG Ratio: 1.9 (calc) (ref 1.0–2.5)
ALT: 22 U/L (ref 6–29)
AST: 21 U/L (ref 10–30)
Albumin: 4.3 g/dL (ref 3.6–5.1)
Alkaline phosphatase (APISO): 73 U/L (ref 31–125)
BUN: 14 mg/dL (ref 7–25)
CO2: 30 mmol/L (ref 20–32)
Calcium: 9.7 mg/dL (ref 8.6–10.2)
Chloride: 104 mmol/L (ref 98–110)
Creat: 0.84 mg/dL (ref 0.50–0.99)
Globulin: 2.3 g/dL (calc) (ref 1.9–3.7)
Glucose, Bld: 87 mg/dL (ref 65–139)
Potassium: 4.1 mmol/L (ref 3.5–5.3)
Sodium: 140 mmol/L (ref 135–146)
Total Bilirubin: 0.4 mg/dL (ref 0.2–1.2)
Total Protein: 6.6 g/dL (ref 6.1–8.1)
eGFR: 88 mL/min/{1.73_m2} (ref >=60)

## 2021-07-08 LAB — CBC
HCT: 39.6 % (ref 35.0–45.0)
Hemoglobin: 13 g/dL (ref 11.7–15.5)
MCH: 30.5 pg (ref 27.0–33.0)
MCHC: 32.8 g/dL (ref 32.0–36.0)
MCV: 93 fL (ref 80.0–100.0)
MPV: 9.5 fL (ref 7.5–12.5)
Platelets: 289 10*3/uL (ref 140–400)
RBC: 4.26 10*6/uL (ref 3.80–5.10)
RDW: 12.2 % (ref 11.0–15.0)
WBC: 5.9 10*3/uL (ref 3.8–10.8)

## 2021-07-08 LAB — VITAMIN D 25 HYDROXY (VIT D DEFICIENCY, FRACTURES): Vit D, 25-Hydroxy: 28 ng/mL — ABNORMAL LOW (ref 30–100)

## 2021-07-08 LAB — LIPID PANEL
Cholesterol: 213 mg/dL — ABNORMAL HIGH (ref <200)
HDL: 65 mg/dL (ref >=50)
LDL Cholesterol (Calc): 129 mg/dL (calc) — ABNORMAL HIGH
Non-HDL Cholesterol (Calc): 148 mg/dL (calc) — ABNORMAL HIGH (ref <130)
Total CHOL/HDL Ratio: 3.3 (calc) (ref <5.0)
Triglycerides: 93 mg/dL (ref <150)

## 2021-07-08 LAB — HEPATITIS C ANTIBODY
Hepatitis C Ab: NONREACTIVE
SIGNAL TO CUT-OFF: 0.08 (ref <1.00)

## 2021-07-08 LAB — HIV ANTIBODY (ROUTINE TESTING W REFLEX): HIV 1&2 Ab, 4th Generation: NONREACTIVE

## 2021-07-10 ENCOUNTER — Ambulatory Visit: Payer: Managed Care, Other (non HMO) | Admitting: Anesthesiology

## 2021-07-10 ENCOUNTER — Encounter: Payer: Self-pay | Admitting: Gastroenterology

## 2021-07-10 ENCOUNTER — Ambulatory Visit: Payer: 59 | Admitting: Internal Medicine

## 2021-07-10 ENCOUNTER — Ambulatory Visit
Admission: RE | Admit: 2021-07-10 | Discharge: 2021-07-10 | Disposition: A | Discharge status: Home / Self Care | Payer: Managed Care, Other (non HMO) | Attending: Gastroenterology | Admitting: Gastroenterology

## 2021-07-10 ENCOUNTER — Encounter
Admission: RE | Disposition: A | Discharge status: Home / Self Care | Payer: Self-pay | Source: Home / Self Care | Attending: Gastroenterology

## 2021-07-10 DIAGNOSIS — R1013 Epigastric pain: Secondary | ICD-10-CM | POA: Diagnosis not present

## 2021-07-10 DIAGNOSIS — Z791 Long term (current) use of non-steroidal anti-inflammatories (NSAID): Secondary | ICD-10-CM

## 2021-07-10 DIAGNOSIS — K921 Melena: Secondary | ICD-10-CM

## 2021-07-10 DIAGNOSIS — K625 Hemorrhage of anus and rectum: Secondary | ICD-10-CM | POA: Diagnosis not present

## 2021-07-10 DIAGNOSIS — K640 First degree hemorrhoids: Secondary | ICD-10-CM | POA: Insufficient documentation

## 2021-07-10 HISTORY — PX: ESOPHAGOGASTRODUODENOSCOPY: SHX5428

## 2021-07-10 HISTORY — PX: COLONOSCOPY WITH PROPOFOL: SHX5780

## 2021-07-10 SURGERY — COLONOSCOPY WITH PROPOFOL
Anesthesia: General

## 2021-07-10 MED ORDER — PROPOFOL 500 MG/50ML IV EMUL
INTRAVENOUS | Status: DC | PRN
  Administered 2021-07-10: 170 ug/kg/min via INTRAVENOUS

## 2021-07-10 MED ORDER — PROPOFOL 10 MG/ML IV BOLUS
INTRAVENOUS | Status: DC | PRN
  Administered 2021-07-10: 70 mg via INTRAVENOUS

## 2021-07-10 MED ORDER — SODIUM CHLORIDE 0.9 % IV SOLN: INTRAVENOUS | Status: DC

## 2021-07-10 NOTE — Op Note (Signed)
[facility] ?Gastroenterology ?Patient Name: Jean Rios ?Procedure Date: 07/10/2021 10:25 AM ?MRN: [id number] ?Account #: [id number] ?Date of Birth: 11/22/1975 ?Admit Type: Outpatient ?Age: 46 ?Room: [facility] ENDO ROOM 3 ?Gender: Female ?Note Status: Finalized ?Instrument Name: Colonscope [id number] ?Procedure:             Colonoscopy ?Indications:           Rectal bleeding ?Providers:             [name] MD, MD ?Referring MD:          Lorre Munroe (Referring MD) ?Medicines:             Monitored Anesthesia Care ?Complications:         No immediate complications. ?Procedure:             Pre-Anesthesia Assessment: ?                       - Prior to the procedure, a History and Physical was  ?                       performed, and patient medications, allergies and  ?                       sensitivities were reviewed. The patient's tolerance  ?                       of previous anesthesia was reviewed. ?                       - The risks and benefits of the procedure and the  ?                       sedation options and risks were discussed with the  ?                       patient. All questions were answered and informed  ?                       consent was obtained. ?                       - ASA Grade Assessment: II - A patient with mild  ?                       systemic disease. ?                       After obtaining informed consent, the colonoscope was  ?                       passed under direct vision. Throughout the procedure,  ?                       the patient's blood pressure, pulse, and oxygen  ?                       saturations were monitored continuously. The  ?                       Colonoscope was introduced through the anus  and  ?                       advanced to the the cecum, identified by the  ?                       appendiceal orifice. The colonoscopy was performed  ?                       with ease. The patient tolerated the procedure well.  ?                        The quality of the bowel preparation was excellent. ?Findings: ?     The perianal and digital rectal examinations were normal. ?     Non-bleeding internal hemorrhoids were found during retroflexion. The  ?     hemorrhoids were large and Grade I (internal hemorrhoids that do not  ?     prolapse). ?     The exam was otherwise without abnormality on direct and retroflexion  ?     views. ?Impression:            - Non-bleeding internal hemorrhoids. ?                       - The examination was otherwise normal on direct and  ?                       retroflexion views. ?                       - No specimens collected. ?Recommendation:        - Discharge patient to home (with escort). ?                       - Resume previous diet. ?                       - Continue present medications. ?                       - Repeat colonoscopy in 10 years for screening  ?                       purposes. ?                       - Return to GI office PRN. ?                       - If has further rectal bleeding can perform  ?                       hemorroidal banding at the office ?Procedure Code(s):     --- Professional --- ?                       [phone], Colonoscopy, flexible; diagnostic, including  ?                       collection of specimen(s) by brushing or washing, when  ?  performed (separate procedure) ?Diagnosis Code(s):     --- Professional --- ?                       K64.0, First degree hemorrhoids ?                       K62.5, Hemorrhage of anus and rectum ?CPT copyright 2019 American Medical Association. All rights reserved. ?The codes documented in this report are preliminary and upon coder review may  ?be revised to meet current compliance requirements. ?[name], MD ?[name] MD, MD ?07/10/2021 10:51:14 AM ?This report has been signed electronically. ?Number of Addenda: 0 ?Note Initiated On: 07/10/2021 10:25 AM ?Scope Withdrawal Time: 0 hours 10 minutes 24 seconds  ?Total Procedure Duration: 0  hours 13 minutes 48 seconds  ?Estimated Blood Loss:  Estimated blood loss: none. ?     [facility] ?

## 2021-07-10 NOTE — Op Note (Signed)
[facility] ?Gastroenterology ?Patient Name: Jean Rios ?Procedure Date: 07/10/2021 10:26 AM ?MRN: [id number] ?Account #: [id number] ?Date of Birth: [birth year] ?Admit Type: Outpatient ?Age: 46 ?Room: [facility] ENDO ROOM 3 ?Gender: Female ?Note Status: Finalized ?Instrument Name: Upper Endoscope [id number] ?Procedure:             Upper GI endoscopy ?Indications:           Dyspepsia ?Providers:             [name] MD, MD ?Referring MD:          Lorre Munroe (Referring MD) ?Medicines:             Monitored Anesthesia Care ?Complications:         No immediate complications. ?Procedure:             Pre-Anesthesia Assessment: ?                       - Prior to the procedure, a History and Physical was  ?                       performed, and patient medications, allergies and  ?                       sensitivities were reviewed. The patient's tolerance  ?                       of previous anesthesia was reviewed. ?                       - The risks and benefits of the procedure and the  ?                       sedation options and risks were discussed with the  ?                       patient. All questions were answered and informed  ?                       consent was obtained. ?                       - ASA Grade Assessment: II - A patient with mild  ?                       systemic disease. ?                       After obtaining informed consent, the endoscope was  ?                       passed under direct vision. Throughout the procedure,  ?                       the patient's blood pressure, pulse, and oxygen  ?                       saturations were monitored continuously. The Endoscope  ?                       was introduced through  the mouth, and advanced to the  ?                       third part of duodenum. The upper GI endoscopy was  ?                       accomplished with ease. The patient tolerated the  ?                       procedure well. ?Findings: ?     The examined esophagus  was normal. ?     The examined duodenum was normal. ?     The entire examined stomach was normal. Biopsies were taken with a cold  ?     forceps for histology. ?     The cardia and gastric fundus were normal on retroflexion. ?Impression:            - Normal esophagus. ?                       - Normal examined duodenum. ?                       - Normal stomach. Biopsied. ?Recommendation:        - Await pathology results. ?                       - Perform a colonoscopy tomorrow. ?Procedure Code(s):     --- Professional --- ?                       [phone], Esophagogastroduodenoscopy, flexible,  ?                       transoral; with biopsy, single or multiple ?Diagnosis Code(s):     --- Professional --- ?                       R10.13, Epigastric pain ?CPT copyright 2019 American Medical Association. All rights reserved. ?The codes documented in this report are preliminary and upon coder review may  ?be revised to meet current compliance requirements. ?[name], MD ?[name] MD, MD ?07/10/2021 10:34:03 AM ?This report has been signed electronically. ?Number of Addenda: 0 ?Note Initiated On: 07/10/2021 10:26 AM ?Estimated Blood Loss:  Estimated blood loss: none. ?     [facility] ?

## 2021-07-10 NOTE — H&P (Signed)
? ? ? ?Jean Mood, MD ?[address], [address], [place], [place], [address] ?[address], [place], [place], [address] ?Phone: [phone]  ?Fax: [phone] ? ?Primary Care Physician:  Lorre Munroe, NP ? ? ?Pre-Procedure History & Physical: ?HPI:  Jean Rios is a 46 y.o. female is here for an endoscopy and colonoscopy  ?  ?Past Medical History:  ?Diagnosis Date  ? Anemia   ? Anxiety   ? Chronic back pain   ? Depression   ? Fibromyalgia   ? Frequent headaches   ? Hyperlipidemia   ? Kidney stone   ? Nerve damage   ? POTS (postural orthostatic tachycardia syndrome)   ? Stroke [facility])   ? ? ?Past Surgical History:  ?Procedure Laterality Date  ? PARTIAL HYSTERECTOMY  2014  ? ? ?Prior to Admission medications   ?Medication Sig Start Date End Date Taking? Authorizing Provider  ?atorvastatin (LIPITOR) 10 MG tablet Take 1 tablet (10 mg total) by mouth daily. 01/22/21  Yes Lorre Munroe, NP  ?hydrOXYzine (ATARAX) 25 MG tablet Take 25 mg by mouth every 8 (eight) hours. 06/05/21  Yes [provider]  ?levocetirizine (XYZAL) 5 MG tablet SMARTSIG:1 Tablet(s) By Mouth Every Evening PRN 06/05/21  Yes [provider]  ?omeprazole (PRILOSEC) 40 MG capsule Take 1 capsule (40 mg total) by mouth 2 (two) times daily. 07/03/21  Yes Lorre Munroe, NP  ?sertraline (ZOLOFT) 100 MG tablet Take 1 tablet (100 mg total) by mouth daily. 07/03/21  Yes Lorre Munroe, NP  ?mometasone (ELOCON) 0.1 % cream  06/05/21   [provider]  ? ? ?Allergies as of 06/23/2021 - Review Complete 06/23/2021  ?Allergen Reaction Noted  ? Dust mite extract Hives and Itching 06/05/2021  ? Mixed feathers Itching 06/05/2021  ? ? ?History reviewed. No pertinent family history. ? ?Social History  ? ?Socioeconomic History  ? Marital status: Married  ?  Spouse name: Not on file  ? Number of children: Not on file  ? Years of education: Not on file  ? Highest education level: Not on file  ?Occupational History  ? Not on file   ?Tobacco Use  ? Smoking status: Former  ?  Types: Cigarettes  ? Smokeless tobacco: Former  ?  Types: Snuff  ?Substance and Sexual Activity  ? Alcohol use: Yes  ?  Alcohol/week: 3.0 standard drinks  ?  Types: 3 Standard drinks or equivalent per week  ? Drug use: Not Currently  ? Sexual activity: Not on file  ?Other Topics Concern  ? Not on file  ?Social History Narrative  ? Not on file  ? ?Social Determinants of Health  ? ?Financial Resource Strain: Not on file  ?Food Insecurity: Not on file  ?Transportation Needs: Not on file  ?Physical Activity: Not on file  ?Stress: Not on file  ?Social Connections: Not on file  ?Intimate Partner Violence: Not on file  ? ? ?Review of Systems: ?See HPI, otherwise negative ROS ? ?Physical Exam: ?BP 101/62   Pulse 60   Temp (!) 97.5 ?F (36.4 ?C) (Temporal)   Resp 16   Ht 5\' 5"  (1.651 m)   Wt 65.8 kg   LMP  (LMP Unknown)   SpO2 100%   BMI 24.13 kg/m?  ?General:   Alert,  pleasant and cooperative in NAD ?Head:  Normocephalic and atraumatic. ?Neck:  Supple; no masses or thyromegaly. ?Lungs:  Clear throughout to auscultation, normal respiratory effort.    ?Heart:  +S1, +  S2, Regular rate and rhythm, No edema. ?Abdomen:  Soft, nontender and nondistended. Normal bowel sounds, without guarding, and without rebound.   ?Neurologic:  Alert and  oriented x4;  grossly normal neurologically. ? ?Impression/Plan: ?Jean Rios is here for an endoscopy and colonoscopy  to be performed for  evaluation of dyspepsia and rectal bleeding ./  ?   ?Risks, benefits, limitations, and alternatives regarding endoscopy have been reviewed with the patient.  Questions have been answered.  All parties agreeable. ? ? ?Jean Mood, MD  07/10/2021, 10:16 AM ? ?

## 2021-07-10 NOTE — Transfer of Care (Signed)
Immediate Anesthesia Transfer of Care Note ? ?Patient: Jean Rios ? ?Procedure(s) Performed: COLONOSCOPY WITH PROPOFOL ?ESOPHAGOGASTRODUODENOSCOPY (EGD) ? ?Patient Location: PACU ? ?Anesthesia Type:General ? ?Level of Consciousness: awake and alert  ? ?Airway & Oxygen Therapy: Patient Spontanous Breathing and Patient connected to nasal cannula oxygen ? ?Post-op Assessment: Report given to RN and Post -op Vital signs reviewed and stable ? ?Post vital signs: Reviewed and stable ? ?Last Vitals:  ?Vitals Value Taken Time  ?BP    ?Temp    ?Pulse 68 07/10/21 1051  ?Resp 12 07/10/21 1051  ?SpO2 100 % 07/10/21 1051  ?Vitals shown include unvalidated device data. ? ?Last Pain:  ?Vitals:  ? 07/10/21 0934  ?TempSrc: Temporal  ?PainSc: 0-No pain  ?   ? ?  ? ?Complications: No notable events documented. ?

## 2021-07-10 NOTE — Anesthesia Postprocedure Evaluation (Signed)
Anesthesia Post Note ? ?Patient: Shadee Meding ? ?Procedure(s) Performed: COLONOSCOPY WITH PROPOFOL ?ESOPHAGOGASTRODUODENOSCOPY (EGD) ? ?Patient location during evaluation: Endoscopy ?Anesthesia Type: General ?Level of consciousness: awake and alert ?Pain management: pain level controlled ?Vital Signs Assessment: post-procedure vital signs reviewed and stable ?Respiratory status: spontaneous breathing, nonlabored ventilation, respiratory function stable and patient connected to nasal cannula oxygen ?Cardiovascular status: blood pressure returned to baseline and stable ?Postop Assessment: no apparent nausea or vomiting ?Anesthetic complications: no ? ? ?No notable events documented. ? ? ?Last Vitals:  ?Vitals:  ? 07/10/21 1052 07/10/21 1057  ?BP: (!) 88/58   ?Pulse:    ?Resp:    ?Temp:  (!) 36.1 ?C  ?SpO2:    ?  ?Last Pain:  ?Vitals:  ? 07/10/21 1124  ?TempSrc:   ?PainSc: 0-No pain  ? ? ?  ?  ?  ?  ?  ?  ? ?Martha Clan ? ? ? ? ?

## 2021-07-10 NOTE — Anesthesia Preprocedure Evaluation (Signed)
Anesthesia Evaluation  ?Patient identified by MRN, date of birth, ID band ?Patient awake ? ? ? ?Reviewed: ?Allergy & Precautions, H&P , NPO status , Patient's Chart, lab work & pertinent test results, reviewed documented beta blocker date and time  ? ?History of Anesthesia Complications ?Negative for: history of anesthetic complications ? ?Airway ?Mallampati: II ? ?TM Distance: >3 FB ?Neck ROM: full ? ? ? Dental ? ?(+) Caps, Dental Advidsory Given, Poor Dentition ?  ?Pulmonary ?neg pulmonary ROS, former smoker,  ?  ?Pulmonary exam normal ?breath sounds clear to auscultation ? ? ? ? ? ? Cardiovascular ?Exercise Tolerance: Good ?negative cardio ROS ?Normal cardiovascular exam ?Rhythm:regular Rate:Normal ? ? ?  ?Neuro/Psych ? Headaches, neg Seizures PSYCHIATRIC DISORDERS Anxiety Depression  Neuromuscular disease (fibromyalgia) CVA, Residual Symptoms   ? GI/Hepatic ?negative GI ROS, Neg liver ROS,   ?Endo/Other  ?negative endocrine ROS ? Renal/GU ?Renal disease (kidney stones)  ?negative genitourinary ?  ?Musculoskeletal ? ? Abdominal ?  ?Peds ? Hematology ?negative hematology ROS ?(+)   ?Anesthesia Other Findings ?Past Medical History: ?No date: Anemia ?No date: Anxiety ?No date: Chronic back pain ?No date: Depression ?No date: Fibromyalgia ?No date: Frequent headaches ?No date: Hyperlipidemia ?No date: Kidney stone ?No date: Nerve damage ?No date: POTS (postural orthostatic tachycardia syndrome) ?No date: Stroke [facility]) ? ? Reproductive/Obstetrics ?negative OB ROS ? ?  ? ? ? ? ? ? ? ? ? ? ? ? ? ?  ?  ? ? ? ? ? ? ? ? ?Anesthesia Physical ?Anesthesia Plan ? ?ASA: 2 ? ?Anesthesia Plan: General  ? ?Post-op Pain Management:   ? ?Induction: Intravenous ? ?PONV Risk Score and Plan: 3 and Propofol infusion and TIVA ? ?Airway Management Planned: Natural Airway and Nasal Cannula ? ?Additional Equipment:  ? ?Intra-op Plan:  ? ?Post-operative Plan:  ? ?Informed Consent: I have reviewed the patients  History and Physical, chart, labs and discussed the procedure including the risks, benefits and alternatives for the proposed anesthesia with the patient or authorized representative who has indicated his/her understanding and acceptance.  ? ? ? ?Dental Advisory Given ? ?Plan Discussed with: Anesthesiologist, CRNA and Surgeon ? ?Anesthesia Plan Comments:   ? ? ? ? ? ? ?Anesthesia Quick Evaluation ? ?

## 2021-07-11 ENCOUNTER — Encounter: Payer: Self-pay | Admitting: Gastroenterology

## 2021-07-11 LAB — SURGICAL PATHOLOGY

## 2021-07-24 ENCOUNTER — Encounter: Payer: Self-pay | Admitting: Internal Medicine

## 2021-07-24 ENCOUNTER — Encounter: Payer: Self-pay | Admitting: Gastroenterology

## 2021-07-24 ENCOUNTER — Ambulatory Visit (INDEPENDENT_AMBULATORY_CARE_PROVIDER_SITE_OTHER): Payer: 59 | Admitting: Gastroenterology

## 2021-07-24 VITALS — BP 110/75 | HR 80 | Temp 97.8°F | Ht 65.0 in | Wt 148.2 lb

## 2021-07-24 DIAGNOSIS — K921 Melena: Secondary | ICD-10-CM

## 2021-07-24 DIAGNOSIS — K648 Other hemorrhoids: Secondary | ICD-10-CM

## 2021-07-24 MED ORDER — ATORVASTATIN CALCIUM 20 MG PO TABS: 20.0000 mg | ORAL_TABLET | Freq: Every day | ORAL | 0 refills | Status: DC

## 2021-07-24 NOTE — Progress Notes (Signed)
?  ?[name] MD, MRCP(U.K) ?[address]  ?[address]  ?[place], [place] [address]  ?Main: [phone]  ?Fax: [phone] ? ? ?Primary Care Physician: Lorre Munroe, NP ? ?Primary Gastroenterologist:  Dr. [name]  ? ?Chief Complaint  ?Patient presents with  ? banding #1  ? ? ?HPI: Jean Rios is a 46 y.o. female ? ?Summary of history : ? ?She was initially referred and seen on 06/23/2021 for blood in the stool.  Painless going on for about a few weeks at least issues with constipation previously.  Family history of colon cancer. ? ?Interval history   06/23/2021-07/24/2021 ? ?07/10/2021: EGD: Normal.  07/10/2021 colonoscopy: Nonbleeding internal hemorrhoids were found that were large.  Biopsies of the stomach showed no abnormalities. ? ?She states that she continues to have on and off bleeding with blood on the tissue paper.  She has difficulty putting on the stool urine if it is watery which is longstanding. ? ?Current Outpatient Medications  ?Medication Sig Dispense Refill  ? atorvastatin (LIPITOR) 20 MG tablet Take 1 tablet (20 mg total) by mouth daily. 90 tablet 0  ? famotidine (PEPCID) 40 MG tablet Take 1 tablet by mouth in the morning and at bedtime.    ? loratadine (CLARITIN) 10 MG tablet Take 1 tablet by mouth daily.    ? mometasone (ELOCON) 0.1 % cream 1 application    ? omeprazole (PRILOSEC) 40 MG capsule Take 1 capsule (40 mg total) by mouth 2 (two) times daily. 180 capsule 1  ? sertraline (ZOLOFT) 100 MG tablet Take 1 tablet (100 mg total) by mouth daily. 90 tablet 1  ? ?No current facility-administered medications for this visit.  ? ? ?Allergies as of 07/24/2021 - Review Complete 07/24/2021  ?Allergen Reaction Noted  ? Dust mite extract Hives and Itching 06/05/2021  ? Mixed feathers Itching 06/05/2021  ? ? ?ROS: ? ?General: Negative for anorexia, weight loss, fever, chills, fatigue, weakness. ?ENT: Negative for hoarseness, difficulty swallowing , nasal congestion. ?CV: Negative for chest  pain, angina, palpitations, dyspnea on exertion, peripheral edema.  ?Respiratory: Negative for dyspnea at rest, dyspnea on exertion, cough, sputum, wheezing.  ?GI: See history of present illness. ?GU:  Negative for dysuria, hematuria, urinary incontinence, urinary frequency, nocturnal urination.  ?Endo: Negative for unusual weight change.  ?  ?Physical Examination: ? ? BP 110/75   Pulse 80   Temp 97.8 ?F (36.6 ?C) (Oral)   Ht 5\' 5"  (1.651 m)   Wt 148 lb 3.2 oz (67.2 kg)   LMP  (LMP Unknown)   BMI 24.66 kg/m?  ? ?General: Well-nourished, well-developed in no acute distress.  ?Eyes: No icterus. Conjunctivae pink. ?Mouth: Oropharyngeal mucosa moist and pink , no lesions erythema or exudate. ?Neuro: Alert and oriented x 3.  Grossly intact. ?Skin: Warm and dry, no jaundice.   ?Psych: Alert and cooperative, normal mood and affect. ? ?PROCEDURE NOTE: ?The patient presents with symptomatic grade 1 hemorrhoids, unresponsive to maximal medical therapy, requesting rubber band ligation of his/her hemorrhoidal disease.  All risks, benefits and alternative forms of therapy were described and informed consent was obtained. ? ?In the Left Lateral Decubitus position (if anoscopy is performed) anoscopic examination revealed grade 1 hemorrhoids in the RA and LL position(s).  ? ?The decision was made to band the RA internal hemorrhoid, and the [facility] O?Regan System was used to perform band ligation without complication.  Digital anorectal examination was then performed to assure proper positioning of the band, and to adjust  the banded tissue as required.  The patient was discharged home without pain or other issues.  Dietary and behavioral recommendations were given and (if necessary - prescriptions were given), along with follow-up instructions.  The patient will return 4 weeks for follow-up and possible additional banding as required. ? ?No complications were encountered and the patient tolerated the procedure well. ?  ?Imaging  Studies: ?No results found. ? ?Assessment and Plan:  ? ?Jean Rios is a 46 y.o. y/o female is here today to discuss about rectal bleeding from internal hemorrhoids.  Today I banded the right anterior hemorrhoidal column.  She will return back in 4 weeks for repeat banding ? ? ?Dr [name]  MD,MRCP [facility]) ?Follow up in 4 weeks for repeat banding ?

## 2021-08-05 ENCOUNTER — Encounter: Payer: Self-pay | Admitting: Internal Medicine

## 2021-08-05 MED ORDER — SERTRALINE HCL 100 MG PO TABS: 100.0000 mg | ORAL_TABLET | Freq: Every day | ORAL | 1 refills | Status: DC

## 2021-08-06 ENCOUNTER — Encounter: Payer: Self-pay | Admitting: Internal Medicine

## 2021-08-25 ENCOUNTER — Ambulatory Visit (INDEPENDENT_AMBULATORY_CARE_PROVIDER_SITE_OTHER): Payer: Self-pay | Admitting: Gastroenterology

## 2021-08-25 DIAGNOSIS — Z91199 Patient's noncompliance with other medical treatment and regimen due to unspecified reason: Secondary | ICD-10-CM

## 2021-08-25 NOTE — Progress Notes (Signed)
No show

## 2021-09-08 ENCOUNTER — Ambulatory Visit
Admission: RE | Admit: 2021-09-08 | Discharge: 2021-09-08 | Disposition: A | Discharge status: Home / Self Care | Payer: Managed Care, Other (non HMO) | Attending: Internal Medicine | Admitting: Internal Medicine

## 2021-09-08 ENCOUNTER — Ambulatory Visit
Admission: RE | Admit: 2021-09-08 | Discharge: 2021-09-08 | Disposition: A | Discharge status: Home / Self Care | Payer: Managed Care, Other (non HMO) | Source: Ambulatory Visit | Attending: Internal Medicine | Admitting: Internal Medicine

## 2021-09-08 ENCOUNTER — Ambulatory Visit: Payer: Managed Care, Other (non HMO) | Admitting: Internal Medicine

## 2021-09-08 ENCOUNTER — Encounter: Payer: Self-pay | Admitting: Internal Medicine

## 2021-09-08 VITALS — BP 100/64 | HR 68 | Temp 96.9°F | Wt 159.0 lb

## 2021-09-08 DIAGNOSIS — E559 Vitamin D deficiency, unspecified: Secondary | ICD-10-CM

## 2021-09-08 DIAGNOSIS — Z6829 Body mass index (BMI) 29.0-29.9, adult: Secondary | ICD-10-CM | POA: Insufficient documentation

## 2021-09-08 DIAGNOSIS — M2550 Pain in unspecified joint: Secondary | ICD-10-CM | POA: Diagnosis present

## 2021-09-08 DIAGNOSIS — M2540 Effusion, unspecified joint: Secondary | ICD-10-CM

## 2021-09-08 DIAGNOSIS — Z6826 Body mass index (BMI) 26.0-26.9, adult: Secondary | ICD-10-CM

## 2021-09-08 DIAGNOSIS — E663 Overweight: Secondary | ICD-10-CM

## 2021-09-08 MED ORDER — PREDNISONE 10 MG PO TABS: ORAL_TABLET | ORAL | 0 refills | Status: DC

## 2021-09-08 NOTE — Patient Instructions (Signed)
Joint Pain  Joint pain can be caused by many things. It is likely to go away if you follow instructions from your doctor for taking care of yourself at home. Sometimes, you may need more treatment. Follow these instructions at home: Managing pain, stiffness, and swelling     If told, put ice on the painful area. To do this: If you have a removable elastic bandage, sling, or splint, take it off as told by your doctor. Put ice in a plastic bag. Place a towel between your skin and the bag. Leave the ice on for 20 minutes, 2-3 times a day. Take off the ice if your skin turns bright red. This is very important. If you cannot feel pain, heat, or cold, you have a greater risk of damage to the area. Move your fingers or toes below the painful joint often. Raise the painful joint above the level of your heart while you are sitting or lying down. If told, put heat on the painful area. Do this as often as told by your doctor. Use the heat source that your doctor recommends, such as a moist heat pack or a heating pad. Place a towel between your skin and the heat source. Leave the heat on for 20-30 minutes. Take off the heat if your skin gets bright red. This is especially important if you are unable to feel pain, heat, or cold. You may have a greater risk of getting burned. Activity Rest the painful joint for as long as told by your doctor. Do not do things that cause pain or make your pain worse. Begin exercising or stretching the affected area, as told by your doctor. Ask your doctor what types of exercise are safe for you. Return to your normal activities when your doctor says that it is safe. If you have an elastic bandage, sling, or splint: Wear it as told by your doctor. Take it only as told by your doctor. Loosen it your fingers or toes below the joint: Tingle. Become numb. Get cold and blue. Keep it clean. Ask your doctor if you should take it off before bathing. If it is not  waterproof: Do not let it get wet. Cover it with a watertight covering when you take a bath or shower. General instructions Take over-the-counter and prescription medicines only as told by your doctor. This may include medicines taken by mouth or applied to the skin. Do not smoke or use any products that contain nicotine or tobacco. If you need help quitting, ask your doctor. Keep all follow-up visits as told by your doctor. This is important. Contact a doctor if: You have pain that gets worse and does not get better with medicine. Your joint pain does not get better in 3 days. You have more bruising or swelling. You have a fever. You lose 10 lb (4.5 kg) or more without trying. Get help right away if: You cannot move the joint. Your fingers or toes tingle, become numb. or get cold and blue. You have a fever along with a joint that is red, warm, and swollen. Summary Joint pain can be caused by many things. It often goes away if you follow instructions from your doctor for taking care of yourself at home. Rest the painful joint for as long as told. Do not do things that cause pain or make your pain worse. Take over-the-counter and prescription medicines only as told by your doctor. This information is not intended to replace advice given to you   by your health care provider. Make sure you discuss any questions you have with your health care provider. Document Revised: 08/02/2019 Document Reviewed: 08/02/2019 Elsevier Patient Education  2023 Elsevier Inc.  

## 2021-09-08 NOTE — Assessment & Plan Note (Signed)
Encourage diet and exercise for weight loss as this can help reduce joint pain 

## 2021-09-08 NOTE — Progress Notes (Signed)
? ?Subjective:  ? ? Patient ID: Jean Rios, female    DOB: [birth year], 46 y.o.   MRN: [id number] ? ?HPI ? ?Patient presents to clinic today with complaint of joint pain.  This started 1.5 weeks ago.  She repots the pain is located in her neck, shoulders, elbows, wrists, hips, knees, ankles and feet. She describes the pain as pulsating and shooting. She reports associated burning pain and numbness from her elbows to her wrist. She reports some joint swelling but denies rash over there joints, redness or warmth. She reports her joints are tender to touch. She denies any recent changes in the last 2 weeks. She reports her stress is pretty much controlled. She does have a history of chronic back pain and fibromyalgia. She has no history of autoimmune disease. ? ?She would also like to have her vitamin D rechecked.  Her last vitamin D level was 27, 06/2021.  She has been taking vitamin D OTC as prescribed. ? ?Review of Systems ? ?   ?Past Medical History:  ?Diagnosis Date  ? Anemia   ? Anxiety   ? Chronic back pain   ? Depression   ? Fibromyalgia   ? Frequent headaches   ? Hyperlipidemia   ? Kidney stone   ? Nerve damage   ? POTS (postural orthostatic tachycardia syndrome)   ? Stroke [facility])   ? ? ?Current Outpatient Medications  ?Medication Sig Dispense Refill  ? atorvastatin (LIPITOR) 20 MG tablet Take 1 tablet (20 mg total) by mouth daily. 90 tablet 0  ? famotidine (PEPCID) 40 MG tablet Take 1 tablet by mouth in the morning and at bedtime.    ? loratadine (CLARITIN) 10 MG tablet Take 1 tablet by mouth daily.    ? mometasone (ELOCON) 0.1 % cream 1 application    ? omeprazole (PRILOSEC) 40 MG capsule Take 1 capsule (40 mg total) by mouth 2 (two) times daily. 180 capsule 1  ? sertraline (ZOLOFT) 100 MG tablet Take 1 tablet (100 mg total) by mouth daily. 90 tablet 1  ? ?No current facility-administered medications for this visit.  ? ? ?Allergies  ?Allergen Reactions  ? Dust Mite Extract Hives and Itching  ? Mixed  Feathers Itching  ? ? ?No family history on file. ? ?Social History  ? ?Socioeconomic History  ? Marital status: Married  ?  Spouse name: Not on file  ? Number of children: Not on file  ? Years of education: Not on file  ? Highest education level: Not on file  ?Occupational History  ? Not on file  ?Tobacco Use  ? Smoking status: Former  ?  Types: Cigarettes  ? Smokeless tobacco: Former  ?  Types: Snuff  ?Substance and Sexual Activity  ? Alcohol use: Yes  ?  Alcohol/week: 3.0 standard drinks  ?  Types: 3 Standard drinks or equivalent per week  ? Drug use: Not Currently  ? Sexual activity: Not on file  ?Other Topics Concern  ? Not on file  ?Social History Narrative  ? Not on file  ? ?Social Determinants of Health  ? ?Financial Resource Strain: Not on file  ?Food Insecurity: Not on file  ?Transportation Needs: Not on file  ?Physical Activity: Not on file  ?Stress: Not on file  ?Social Connections: Not on file  ?Intimate Partner Violence: Not on file  ? ? ? ?Constitutional: Denies fever, malaise, fatigue, headache or abrupt weight changes.  ?Respiratory: Denies difficulty breathing, shortness of breath, cough or sputum production.   ?  Cardiovascular: Denies chest pain, chest tightness, palpitations or swelling in the hands or feet.  ?Musculoskeletal: Patient reports joint pain, intermittent joint swelling.  Denies decrease in range of motion, difficulty with gait, muscle pain.  ?Skin: Denies redness, rashes, lesions or ulcercations.  ?Neurological: Patient reports numbness in her hands.  Denies dizziness, difficulty with memory, difficulty with speech or problems with balance and coordination.  ? ? ?No other specific complaints in a complete review of systems (except as listed in HPI above). ? ?Objective:  ? Physical Exam ?BP 100/64 (BP Location: Left Arm, Patient Position: Sitting, Cuff Size: Normal)   Pulse 68   Temp (!) 96.9 ?F (36.1 ?C) (Temporal)   Wt 159 lb (72.1 kg)   LMP  (LMP Unknown)   SpO2 99%   BMI  26.46 kg/m?  ? ?Wt Readings from Last 3 Encounters:  ?07/24/21 148 lb 3.2 oz (67.2 kg)  ?07/10/21 145 lb (65.8 kg)  ?07/07/21 147 lb (66.7 kg)  ? ? ?General: Appears her stated age, overweight, in NAD. ?Skin: Warm, dry and intact. No rashes noted. ?Cardiovascular: Normal rate. ?Pulmonary/Chest: Normal effort. ?Musculoskeletal: Normal flexion, extension and rotation of the cervical spine.  Normal internal and external rotation of bilateral shoulders.  Normal flexion, extension and rotation of bilateral elbows.  Normal flexion, extension and rotation bilateral wrists.  Normal abduction, abduction, internal and external rotation bilateral hips.  Normal flexion and extension of bilateral knees.  Flexion, extension and rotation bilateral ankles.  No joint swelling noted.  Strength 4/5 BUE/BLE.  No difficulty with gait. ?Neurological: Alert and oriented.  Coordination normal.  ? ? ?BMET ?   ?Component Value Date/Time  ? NA 140 07/07/2021 1024  ? K 4.1 07/07/2021 1024  ? CL 104 07/07/2021 1024  ? CO2 30 07/07/2021 1024  ? GLUCOSE 87 07/07/2021 1024  ? BUN 14 07/07/2021 1024  ? CREATININE 0.84 07/07/2021 1024  ? CALCIUM 9.7 07/07/2021 1024  ? GFRNONAA >60 03/03/2021 1210  ? ? ?Lipid Panel  ?   ?Component Value Date/Time  ? CHOL 213 (H) 07/07/2021 1024  ? TRIG 93 07/07/2021 1024  ? HDL 65 07/07/2021 1024  ? CHOLHDL 3.3 07/07/2021 1024  ? LDLCALC 129 (H) 07/07/2021 1024  ? ? ?CBC ?   ?Component Value Date/Time  ? WBC 5.9 07/07/2021 1024  ? RBC 4.26 07/07/2021 1024  ? HGB 13.0 07/07/2021 1024  ? HCT 39.6 07/07/2021 1024  ? PLT 289 07/07/2021 1024  ? MCV 93.0 07/07/2021 1024  ? MCH 30.5 07/07/2021 1024  ? MCHC 32.8 07/07/2021 1024  ? RDW 12.2 07/07/2021 1024  ? LYMPHSABS 2.4 03/02/2020 1828  ? MONOABS 0.7 03/02/2020 1828  ? EOSABS 0.3 03/02/2020 1828  ? BASOSABS 0.1 03/02/2020 1828  ? ? ?Hgb A1C ?Lab Results  ?Component Value Date  ? HGBA1C 5.2 07/07/2021  ? ? ? ? ? ? ?   ?Assessment & Plan:  ? ?Multiple Joint Pain,  Intermittent Joint Swelling: ? ?We will check ANA, rheumatoid factor, ESR and CRP ?Rx for Pred taper x9 days ? ?Bilateral Knee Pain: ? ?X-ray bilateral knees today ?Rx for Pred taper x9 days ? ?Vit D Deficiency: ? ?Vitamin D level today ? ?RTC in 4 months, followup chronic conditions ?Webb Silversmith, NP ? ?

## 2021-09-10 ENCOUNTER — Encounter: Payer: Self-pay | Admitting: Internal Medicine

## 2021-09-10 LAB — VITAMIN D 25 HYDROXY (VIT D DEFICIENCY, FRACTURES): Vit D, 25-Hydroxy: 34 ng/mL (ref 30–100)

## 2021-09-10 LAB — RHEUMATOID FACTOR: Rheumatoid fact SerPl-aCnc: <14 IU/mL (ref <14)

## 2021-09-10 LAB — C-REACTIVE PROTEIN: CRP: 1.4 mg/L (ref <8.0)

## 2021-09-10 LAB — SEDIMENTATION RATE: Sed Rate: 2 mm/h (ref 0–20)

## 2021-09-10 LAB — ANA: Anti Nuclear Antibody (ANA): NEGATIVE

## 2021-09-30 ENCOUNTER — Other Ambulatory Visit: Payer: Self-pay | Admitting: Internal Medicine

## 2021-10-01 NOTE — Telephone Encounter (Signed)
Refilled 08/05/2021 #90 1 refill - Receipt confirmed by [facility]. Requested Prescriptions  Pending Prescriptions Disp Refills  . sertraline (ZOLOFT) 100 MG tablet [Pharmacy Med Name: SERTRALINE HCL 100 MG TABLET] 90 tablet 2    Sig: TAKE 1 TABLET BY MOUTH EVERY DAY     Psychiatry:  Antidepressants - SSRI - sertraline Passed - 09/30/2021  1:32 PM      Passed - AST in normal range and within 360 days    AST  Date Value Ref Range Status  07/07/2021 21 10 - 30 U/L Final         Passed - ALT in normal range and within 360 days    ALT  Date Value Ref Range Status  07/07/2021 22 6 - 29 U/L Final         Passed - Completed PHQ-2 or PHQ-9 in the last 360 days      Passed - Valid encounter within last 6 months    Recent Outpatient Visits          3 weeks ago Multiple joint pain   [facility] [place], Salvadore Oxford, NP   2 months ago Encounter for general adult medical examination with abnormal findings   [facility] [place], Salvadore Oxford, NP   3 months ago Other fatigue   [facility] [place], Salvadore Oxford, NP   5 months ago Hives   [facility] [place], Salvadore Oxford, NP   8 months ago Pure hypercholesterolemia   [facility] [place], Salvadore Oxford, NP      Future Appointments            Tomorrow [facility], Salvadore Oxford, NP [facility], [facility]

## 2021-10-02 ENCOUNTER — Ambulatory Visit: Payer: Managed Care, Other (non HMO) | Admitting: Internal Medicine

## 2021-10-02 NOTE — Progress Notes (Deleted)
Subjective:    Patient ID: Jean Rios, female    DOB: 09/27/1975, 46 y.o.   MRN: [id number]  HPI  Pt presents to the clinic today for follow up of joint pain. This has an been an ongoing issue. The pain is located in her elbows, wrist and hands. She describes the pain as. She denies joint swelling, numbness, tingling or weakness. She was 5/23 for the same. Autoimmune workup at that time was negative.  Review of Systems  Past Medical History:  Diagnosis Date   Anemia    Anxiety    Chronic back pain    Depression    Fibromyalgia    Frequent headaches    Hyperlipidemia    Kidney stone    Nerve damage    POTS (postural orthostatic tachycardia syndrome)    Stroke [facility])     Current Outpatient Medications  Medication Sig Dispense Refill   atorvastatin (LIPITOR) 20 MG tablet Take 1 tablet (20 mg total) by mouth daily. 90 tablet 0   famotidine (PEPCID) 40 MG tablet Take 1 tablet by mouth in the morning and at bedtime.     loratadine (CLARITIN) 10 MG tablet Take 1 tablet by mouth daily.     mometasone (ELOCON) 0.1 % cream 1 application     omeprazole (PRILOSEC) 40 MG capsule Take 1 capsule (40 mg total) by mouth 2 (two) times daily. 180 capsule 1   predniSONE (DELTASONE) 10 MG tablet Take 3 tabs on days 1-3, 2 tabs on days 4-6, 1 tab on days 7-9 18 tablet 0   sertraline (ZOLOFT) 100 MG tablet Take 1 tablet (100 mg total) by mouth daily. 90 tablet 1   No current facility-administered medications for this visit.    Allergies  Allergen Reactions   Dust Mite Extract Hives and Itching   Mixed Feathers Itching    No family history on file.  Social History   Socioeconomic History   Marital status: Married    Spouse name: Not on file   Number of children: Not on file   Years of education: Not on file   Highest education level: Not on file  Occupational History   Not on file  Tobacco Use   Smoking status: Former    Types: Cigarettes   Smokeless tobacco: Former     Types: Snuff  Substance and Sexual Activity   Alcohol use: Yes    Alcohol/week: 3.0 standard drinks    Types: 3 Standard drinks or equivalent per week   Drug use: Not Currently   Sexual activity: Not on file  Other Topics Concern   Not on file  Social History Narrative   Not on file   Social Determinants of Health   Financial Resource Strain: Not on file  Food Insecurity: Not on file  Transportation Needs: Not on file  Physical Activity: Not on file  Stress: Not on file  Social Connections: Not on file  Intimate Partner Violence: Not on file     Constitutional: Denies fever, malaise, fatigue, headache or abrupt weight changes.  HEENT: Denies eye pain, eye redness, ear pain, ringing in the ears, wax buildup, runny nose, nasal congestion, bloody nose, or sore throat. Respiratory: Denies difficulty breathing, shortness of breath, cough or sputum production.   Cardiovascular: Denies chest pain, chest tightness, palpitations or swelling in the hands or feet.  Gastrointestinal: Denies abdominal pain, bloating, constipation, diarrhea or blood in the stool.  GU: Denies urgency, frequency, pain with urination, burning sensation, blood in urine,  odor or discharge. Musculoskeletal: Pt reports joint pain. Denies decrease in range of motion, difficulty with gait, muscle pain or joint swelling.  Skin: Denies redness, rashes, lesions or ulcercations.  Neurological: Denies dizziness, difficulty with memory, difficulty with speech or problems with balance and coordination.  Psych: Denies anxiety, depression, SI/HI.  No other specific complaints in a complete review of systems (except as listed in HPI above).     Objective:   Physical Exam   LMP  (LMP Unknown)  Wt Readings from Last 3 Encounters:  09/08/21 159 lb (72.1 kg)  07/24/21 148 lb 3.2 oz (67.2 kg)  07/10/21 145 lb (65.8 kg)    General: Appears their stated age, well developed, well nourished in NAD. Skin: Warm, dry and  intact. No rashes, lesions or ulcerations noted. HEENT: Head: normal shape and size; Eyes: sclera white, no icterus, conjunctiva pink, PERRLA and EOMs intact; Ears: Tm's gray and intact, normal light reflex; Nose: mucosa pink and moist, septum midline; Throat/Mouth: Teeth present, mucosa pink and moist, no exudate, lesions or ulcerations noted.  Neck:  Neck supple, trachea midline. No masses, lumps or thyromegaly present.  Cardiovascular: Normal rate and rhythm. S1,S2 noted.  No murmur, rubs or gallops noted. No JVD or BLE edema. No carotid bruits noted. Pulmonary/Chest: Normal effort and positive vesicular breath sounds. No respiratory distress. No wheezes, rales or ronchi noted.  Abdomen: Soft and nontender. Normal bowel sounds. No distention or masses noted. Liver, spleen and kidneys non palpable. Musculoskeletal: Normal range of motion. No signs of joint swelling. No difficulty with gait.  Neurological: Alert and oriented. Cranial nerves II-XII grossly intact. Coordination normal.  Psychiatric: Mood and affect normal. Behavior is normal. Judgment and thought content normal.     BMET    Component Value Date/Time   NA 140 07/07/2021 1024   K 4.1 07/07/2021 1024   CL 104 07/07/2021 1024   CO2 30 07/07/2021 1024   GLUCOSE 87 07/07/2021 1024   BUN 14 07/07/2021 1024   CREATININE 0.84 07/07/2021 1024   CALCIUM 9.7 07/07/2021 1024   GFRNONAA >60 03/03/2021 1210    Lipid Panel     Component Value Date/Time   CHOL 213 (H) 07/07/2021 1024   TRIG 93 07/07/2021 1024   HDL 65 07/07/2021 1024   CHOLHDL 3.3 07/07/2021 1024   LDLCALC 129 (H) 07/07/2021 1024    CBC    Component Value Date/Time   WBC 5.9 07/07/2021 1024   RBC 4.26 07/07/2021 1024   HGB 13.0 07/07/2021 1024   HCT 39.6 07/07/2021 1024   PLT 289 07/07/2021 1024   MCV 93.0 07/07/2021 1024   MCH 30.5 07/07/2021 1024   MCHC 32.8 07/07/2021 1024   RDW 12.2 07/07/2021 1024   LYMPHSABS 2.4 03/02/2020 1828   MONOABS 0.7  03/02/2020 1828   EOSABS 0.3 03/02/2020 1828   BASOSABS 0.1 03/02/2020 1828    Hgb A1C Lab Results  Component Value Date   HGBA1C 5.2 07/07/2021           Assessment & Plan:     Nicki Reaper, NP

## 2021-10-27 ENCOUNTER — Ambulatory Visit (INDEPENDENT_AMBULATORY_CARE_PROVIDER_SITE_OTHER): Payer: Managed Care, Other (non HMO) | Admitting: Internal Medicine

## 2021-10-27 ENCOUNTER — Ambulatory Visit
Admission: RE | Admit: 2021-10-27 | Discharge: 2021-10-27 | Disposition: A | Discharge status: Home / Self Care | Payer: Managed Care, Other (non HMO) | Source: Ambulatory Visit | Attending: Internal Medicine | Admitting: Internal Medicine

## 2021-10-27 ENCOUNTER — Encounter: Payer: Self-pay | Admitting: Internal Medicine

## 2021-10-27 ENCOUNTER — Ambulatory Visit
Admission: RE | Admit: 2021-10-27 | Discharge: 2021-10-27 | Disposition: A | Discharge status: Home / Self Care | Payer: Managed Care, Other (non HMO) | Attending: Internal Medicine | Admitting: Internal Medicine

## 2021-10-27 VITALS — BP 136/84 | HR 71 | Temp 96.9°F | Wt 160.0 lb

## 2021-10-27 DIAGNOSIS — M79602 Pain in left arm: Secondary | ICD-10-CM

## 2021-10-27 DIAGNOSIS — M542 Cervicalgia: Secondary | ICD-10-CM | POA: Insufficient documentation

## 2021-10-27 DIAGNOSIS — M79673 Pain in unspecified foot: Secondary | ICD-10-CM | POA: Insufficient documentation

## 2021-10-27 DIAGNOSIS — M79601 Pain in right arm: Secondary | ICD-10-CM | POA: Insufficient documentation

## 2021-10-27 DIAGNOSIS — F32A Depression, unspecified: Secondary | ICD-10-CM

## 2021-10-27 DIAGNOSIS — M79643 Pain in unspecified hand: Secondary | ICD-10-CM

## 2021-10-27 DIAGNOSIS — M797 Fibromyalgia: Secondary | ICD-10-CM

## 2021-10-27 DIAGNOSIS — R202 Paresthesia of skin: Secondary | ICD-10-CM

## 2021-10-27 DIAGNOSIS — G8929 Other chronic pain: Secondary | ICD-10-CM

## 2021-10-27 DIAGNOSIS — F419 Anxiety disorder, unspecified: Secondary | ICD-10-CM

## 2021-10-27 MED ORDER — SERTRALINE HCL 100 MG PO TABS: 150.0000 mg | ORAL_TABLET | Freq: Every day | ORAL | 0 refills | Status: DC

## 2021-10-27 NOTE — Progress Notes (Signed)
Subjective:    Patient ID: Jean Rios, female    DOB: 1976/03/15, 46 y.o.   MRN: [id number]  HPI  Patient presents to clinic today for follow-up of multiple joint pain.  This has been an ongoing issue.  She reports most of the pain is located in her neck, shoulders, elbows, wrist, hips, knees, ankles and feet.  She describes the pain as pulsating and shooting.  She reports associated burning pain and numbness from her elbows to her wrist.  She reports some joint swelling but denies rash over these joints.  She denies redness or warmth.  She reports her joints are tender to touch.  She had a negative autoimmune work-up 09/2021.  She is interested in having x-rays at this time.  She would also like to follow up anxiety and depression. She is current managed Sertraline. She reports situational stress, in laws recently moved in. She denies SI/HI.  Review of Systems     Past Medical History:  Diagnosis Date   Anemia    Anxiety    Chronic back pain    Depression    Fibromyalgia    Frequent headaches    Hyperlipidemia    Kidney stone    Nerve damage    POTS (postural orthostatic tachycardia syndrome)    Stroke [facility])     Current Outpatient Medications  Medication Sig Dispense Refill   atorvastatin (LIPITOR) 20 MG tablet Take 1 tablet (20 mg total) by mouth daily. 90 tablet 0   famotidine (PEPCID) 40 MG tablet Take 1 tablet by mouth in the morning and at bedtime.     loratadine (CLARITIN) 10 MG tablet Take 1 tablet by mouth daily.     mometasone (ELOCON) 0.1 % cream 1 application     omeprazole (PRILOSEC) 40 MG capsule Take 1 capsule (40 mg total) by mouth 2 (two) times daily. 180 capsule 1   predniSONE (DELTASONE) 10 MG tablet Take 3 tabs on days 1-3, 2 tabs on days 4-6, 1 tab on days 7-9 18 tablet 0   sertraline (ZOLOFT) 100 MG tablet Take 1 tablet (100 mg total) by mouth daily. 90 tablet 1   No current facility-administered medications for this visit.    Allergies   Allergen Reactions   Dust Mite Extract Hives and Itching   Mixed Feathers Itching    No family history on file.  Social History   Socioeconomic History   Marital status: Married    Spouse name: Not on file   Number of children: Not on file   Years of education: Not on file   Highest education level: Not on file  Occupational History   Not on file  Tobacco Use   Smoking status: Former    Types: Cigarettes   Smokeless tobacco: Former    Types: Snuff  Substance and Sexual Activity   Alcohol use: Yes    Alcohol/week: 3.0 standard drinks of alcohol    Types: 3 Standard drinks or equivalent per week   Drug use: Not Currently   Sexual activity: Not on file  Other Topics Concern   Not on file  Social History Narrative   Not on file   Social Determinants of Health   Financial Resource Strain: Not on file  Food Insecurity: Not on file  Transportation Needs: Not on file  Physical Activity: Not on file  Stress: Not on file  Social Connections: Not on file  Intimate Partner Violence: Not on file     Constitutional: Denies fever,  malaise, fatigue, headache or abrupt weight changes.  Respiratory: Denies difficulty breathing, shortness of breath, cough or sputum production.   Cardiovascular: Denies chest pain, chest tightness, palpitations or swelling in the hands or feet.  Musculoskeletal: Patient reports multiple joint pain.  Denies decrease in range of motion, difficulty with gait, muscle pain or joint swelling.  Skin: Denies redness, rashes, lesions or ulcercations.  Neurological: Pt reports paresthesias of upper and lower extremities. Denies dizziness, difficulty with memory, difficulty with speech or problems with balance and coordination.  Psych: Pt has a history of anxiety and depression. Denies SI/HI.  No other specific complaints in a complete review of systems (except as listed in HPI above).  Objective:   Physical Exam   BP 136/84 (BP Location: Right Arm,  Patient Position: Sitting, Cuff Size: Normal)   Pulse 71   Temp (!) 96.9 F (36.1 C) (Temporal)   Wt 160 lb (72.6 kg)   LMP  (LMP Unknown)   SpO2 98%   BMI 26.63 kg/m   Wt Readings from Last 3 Encounters:  09/08/21 159 lb (72.1 kg)  07/24/21 148 lb 3.2 oz (67.2 kg)  07/10/21 145 lb (65.8 kg)    General: Appears her stated age, overweight, in NAD. Skin: Warm, dry and intact. No rashes noted. Cardiovascular: Normal rate and rhythm.  Pulmonary/Chest: Normal effort and positive vesicular breath sounds. No respiratory distress. No wheezes, rales or ronchi noted.  Musculoskeletal: Normal range of motion.  Mild tenderness noted over the cervical spine.  Strength 5/5 BUE/BLE. No signs of joint swelling. No difficulty with gait.  Neurological: Alert and oriented.  Positive Phalen's, positive Tinel's. Psychiatric: Mood and affect normal. Behavior is normal. Judgment and thought content normal.     BMET    Component Value Date/Time   NA 140 07/07/2021 1024   K 4.1 07/07/2021 1024   CL 104 07/07/2021 1024   CO2 30 07/07/2021 1024   GLUCOSE 87 07/07/2021 1024   BUN 14 07/07/2021 1024   CREATININE 0.84 07/07/2021 1024   CALCIUM 9.7 07/07/2021 1024   GFRNONAA >60 03/03/2021 1210    Lipid Panel     Component Value Date/Time   CHOL 213 (H) 07/07/2021 1024   TRIG 93 07/07/2021 1024   HDL 65 07/07/2021 1024   CHOLHDL 3.3 07/07/2021 1024   LDLCALC 129 (H) 07/07/2021 1024    CBC    Component Value Date/Time   WBC 5.9 07/07/2021 1024   RBC 4.26 07/07/2021 1024   HGB 13.0 07/07/2021 1024   HCT 39.6 07/07/2021 1024   PLT 289 07/07/2021 1024   MCV 93.0 07/07/2021 1024   MCH 30.5 07/07/2021 1024   MCHC 32.8 07/07/2021 1024   RDW 12.2 07/07/2021 1024   LYMPHSABS 2.4 03/02/2020 1828   MONOABS 0.7 03/02/2020 1828   EOSABS 0.3 03/02/2020 1828   BASOSABS 0.1 03/02/2020 1828    Hgb A1C Lab Results  Component Value Date   HGBA1C 5.2 07/07/2021           Assessment &  Plan:   Multiple Joint Pain, Paresthesia of Upper Extremities:  Xray cervical spine, bilateral hands and bilateral feet Consider referral to ortho for further evaluation Consider referral to neurology for EMG testing  Return precautions discussed  Nicki Reaper, NP

## 2021-10-28 ENCOUNTER — Encounter: Payer: Self-pay | Admitting: Internal Medicine

## 2021-10-28 DIAGNOSIS — G5603 Carpal tunnel syndrome, bilateral upper limbs: Secondary | ICD-10-CM

## 2021-10-28 DIAGNOSIS — G8929 Other chronic pain: Secondary | ICD-10-CM

## 2021-11-12 ENCOUNTER — Ambulatory Visit: Payer: Managed Care, Other (non HMO) | Attending: Internal Medicine | Admitting: Physical Therapy

## 2021-11-12 ENCOUNTER — Other Ambulatory Visit: Payer: Self-pay

## 2021-11-12 DIAGNOSIS — M542 Cervicalgia: Secondary | ICD-10-CM | POA: Insufficient documentation

## 2021-11-12 DIAGNOSIS — G8929 Other chronic pain: Secondary | ICD-10-CM | POA: Diagnosis not present

## 2021-11-12 DIAGNOSIS — M5412 Radiculopathy, cervical region: Secondary | ICD-10-CM | POA: Insufficient documentation

## 2021-11-12 NOTE — Therapy (Unsigned)
OUTPATIENT PHYSICAL THERAPY CERVICAL EVALUATION   Patient Name: Jean Rios MRN: [id number] DOB:1975/07/06, 46 y.o., female Today's Date: 11/13/2021   PT End of Session - 11/13/21 1037     Visit Number 1    Number of Visits 16    Date for PT Re-Evaluation 01/08/22    Authorization Type [employer] based on [place]    PT Start Time 1330    PT Stop Time 1415    PT Time Calculation (min) 45 min    Activity Tolerance Patient limited by pain    Behavior During Therapy [facility] for tasks assessed/performed             Past Medical History:  Diagnosis Date   Anemia    Anxiety    Chronic back pain    Depression    Fibromyalgia    Frequent headaches    Hyperlipidemia    Kidney stone    Nerve damage    POTS (postural orthostatic tachycardia syndrome)    Stroke [facility])    Past Surgical History:  Procedure Laterality Date   COLONOSCOPY WITH PROPOFOL N/A 07/10/2021   Procedure: COLONOSCOPY WITH PROPOFOL;  Surgeon: [name], MD;  Location: [facility] ENDOSCOPY;  Service: Gastroenterology;  Laterality: N/A;   ESOPHAGOGASTRODUODENOSCOPY N/A 07/10/2021   Procedure: ESOPHAGOGASTRODUODENOSCOPY (EGD);  Surgeon: [name], MD;  Location: [facility] ENDOSCOPY;  Service: Gastroenterology;  Laterality: N/A;   PARTIAL HYSTERECTOMY  2014   Patient Active Problem List   Diagnosis Date Noted   Overweight with body mass index (BMI) of 26 to 26.9 in adult 09/08/2021   Idiopathic urticaria 06/23/2021   Anxiety and depression 01/21/2021   Insomnia 01/21/2021   History of TIA (transient ischemic attack) 01/21/2021   Pure hypercholesterolemia 01/21/2021   Frequent headaches 01/21/2021   POTS (postural orthostatic tachycardia syndrome) 01/21/2021   Chronic back pain 01/21/2021   Fibromyalgia 01/21/2021    PCP: Dr. Sampson Si   REFERRING PROVIDER: Dr. Sampson Si   REFERRING DIAG: R51.8,A41.66 (ICD-10-CM) - Chronic neck pain  THERAPY DIAG:  No diagnosis found.  Rationale for Evaluation and Treatment  Rehabilitation  ONSET DATE: 08/13/21  SUBJECTIVE:                                                                                                                                                                                                         SUBJECTIVE STATEMENT: Pt reports onset of n/t in hands that radiates down neck into both of her hands. She is also experiencing arthalgias in elbows. She does have a h/o of fibromyalgia and wonders if her symptoms  are related to this and medical team is trying to work up. Her flares of fibromyalgia is experienced mostly down her back and back of her scalp but she has never had it in arms. She has experiencing  a lot of stress this past year from having multiple loses in her family and moving in laws so that her and her husband could provide care. Has been having trouble with vision seeing double with words and she plans on getting her eyes checked. H/o TIA 2009 which resulted in some slurred speech with phrase "Have a nice day". Has had diagnosis of POTS.   PERTINENT HISTORY:  Per Dr. Janith Lima note on 10/27/21  Patient presents to clinic today for follow-up of multiple joint pain.  This has been an ongoing issue.  She reports most of the pain is located in her neck, shoulders, elbows, wrist, hips, knees, ankles and feet.  She describes the pain as pulsating and shooting.  She reports associated burning pain and numbness from her elbows to her wrist.  She reports some joint swelling but denies rash over these joints.  She denies redness or warmth.  She reports her joints are tender to touch.  She had a negative autoimmune work-up 09/2021.  She is interested in having x-rays at this time.  PAIN:  Are you having pain? Yes: NPRS scale: 4/10 Pain location: Bilateral arms Pain description: Dull numbness in her finger tips  Aggravating factors: Holding a position with her hands like driving and holding the steering wheel. Leaning on arms  Relieving factors:  Nothing really helps when she has a flare just taking time to let it pass   PRECAUTIONS: None  WEIGHT BEARING RESTRICTIONS No  FALLS:  Has patient fallen in last 6 months? No  LIVING ENVIRONMENT: Lives with: lives with their family and lives with their spouse Lives in: House/apartment Stairs: Yes: External: 1 steps; none Has following equipment at home: None  OCCUPATION: EMG tech   PLOF: Independent  PATIENT GOALS To decrease onset of cervical symptoms   OBJECTIVE:   Vitals: BP 120/79 HR 64 SpO2 99           Red Flags:   Dizziness -, Diplopia +, Drop Attacks (loss of power or consciousness), Dysphagia -, Dysarthria, Nystagmus, Nausea or vomiting +, and other neurological symptoms     DIAGNOSTIC FINDINGS:  CLINICAL DATA:  Cervical spine pain   EXAM: CERVICAL SPINE - COMPLETE 4+ VIEW   COMPARISON:  None Available.   FINDINGS: There is no evidence of cervical spine fracture or prevertebral soft tissue swelling. Alignment is normal. Mild intervertebral disc space narrowing at C5-6. No focal bone lesion. Regional soft tissues are unremarkable   IMPRESSION: No acute finding in the cervical spine. Mild degenerative disc disease at C5-6.     Electronically Signed   By: Emmaline Kluver M.D.   On: 10/27/2021 16:10  PATIENT SURVEYS:  FOTO 48/100 with 61 as target score    COGNITION: Overall cognitive status: Within functional limits for tasks assessed   SENSATION: Stocking glove pattern with burning, icy cold tingling that makes it painful to grip  POSTURE: No Significant postural limitations  PALPATION: TTP on cervical spinous process and upper trap    CERVICAL ROM:   Active ROM A/PROM (deg) eval  Flexion 45  Extension 45  Right lateral flexion 45  Left lateral flexion 40  Right rotation 60  Left rotation 55*   (Blank rows = not tested)  *Provocation of n/t down  left arm into hand on all fingers   UPPER EXTREMITY ROM:      Active ROM  Right eval Left eval  Shoulder flexion 180 180  Shoulder extension 60 60  Shoulder abduction 180  180  Shoulder adduction    Shoulder internal rotation 70 70  Shoulder external rotation 90 90  Elbow flexion 150 150  Elbow extension 60 60  Wrist flexion 80 80  Wrist extension 70 70  Wrist ulnar deviation 30 30  Wrist radial deviation 20 20  Wrist pronation    Wrist supination    (Blank rows = not tested)       UPPER EXTREMITY MMT:            Cervical Flexion 4+/5                          Ext: 4+/5                        R SB: 4/5                        L SB: 4/5                         R Rot: 4/5 *                        L Rot: 4/5*  *provocation of n/t down bilateral arms     MMT Right eval Left eval  Shoulder flexion 4+ 4+  Shoulder extension 5 5  Shoulder abduction 4+ 4+  Shoulder adduction    Shoulder extension 5 5  Shoulder internal rotation 5 5  Shoulder external rotation 5 5  Middle trapezius    Lower trapezius    Elbow flexion 4 4  Elbow extension 4 4  Wrist flexion 4 4  Wrist extension 4- 4-  Wrist ulnar deviation    Wrist radial deviation    Wrist pronation    Wrist supination    Grip strength     (Blank rows = not tested)  CERVICAL SPECIAL TESTS:  Cranial cervical flexion test: Positive, Neck flexor muscle endurance test: NT, Upper limb tension test (ULTT): NT, and Spurling's test: Pain and dizziness    FUNCTIONAL TESTS: NT  TODAY'S TREATMENT:  Upper Trap Stretch Left 2 x 30 sec   -min to mod VC to sequence steps to perform exercises    PATIENT EDUCATION:  Education details: form and technique for appropriate exercise and explanation of tests and measures  Person educated: Patient Education method: Programmer, multimedia, Demonstration, Verbal cues, and Handouts Education comprehension: verbalized understanding, returned demonstration, and verbal cues required   HOME EXERCISE PROGRAM: Access Code: [email or web address] URL:  [email or web address] Date: 11/12/2021 Prepared by: Ellin Goodie  Exercises - Seated Upper Trapezius Stretch  - 1 x daily - 7 x weekly - 1 sets - 3 reps - 30 hold  ASSESSMENT:  CLINICAL IMPRESSION: Patient is a 46 y.o. white female who was seen today for physical therapy evaluation and treatment for cervical pain that radiates down both arms. Pt exhibits some positive red flags, but these appear to be related to other comorbidities. Given extensive medical history, many objective measures could not be completed and will be completed at next session. She does show decreased cervical ROM and decreased UE strength. Unclear of MSK cause at this point  as there is no pattern to symptom. Cervical radiculopathy not ruled out at this point. She will benefit from skilled PT to increase cervical ROM and shoulder and parascapular and wrist strength to be able to carry out job related tasks using UE.    OBJECTIVE IMPAIRMENTS decreased ROM, decreased strength, impaired flexibility, impaired sensation, impaired UE functional use, and pain.   ACTIVITY LIMITATIONS carrying, lifting, bending, reach over head, hygiene/grooming, and caring for others  PARTICIPATION LIMITATIONS: cleaning, laundry, driving, and occupation  PERSONAL FACTORS Age, Past/current experiences, Profession, Time since onset of injury/illness/exacerbation, and 3+ comorbidities: fibromyalgia, POTS, chronic back pain, depression, anxiety  are also affecting patient's functional outcome.   REHAB POTENTIAL: Fair chronicity of deficits and psychological comorbidities, fibromyalgia    CLINICAL DECISION MAKING: Evolving/moderate complexity  EVALUATION COMPLEXITY: Moderate   GOALS: Goals reviewed with patient? No  SHORT TERM GOALS: Target date: 11/27/2021   Pt will be independent with HEP in order to improve strength and balance in order to decrease fall risk and improve function at home and work. Baseline: NT Goal status:  INITIAL  2.  Pt will demonstrate understanding of graded exercises within acceptable pain range to be more consistent with HEP.  Baseline: NT  Goal status: INITIAL    LONG TERM GOALS: Target date: 01/08/2022  Patient will have improved function and activity level as evidenced by an increase in FOTO score by 10 points or more.  Baseline: 48/100 with target of 61  Goal status: INITIAL  2.  Patient will improve wrist, shoulder, and parascapular strength by 1/2 MMT for improved cervical stability and reduced symptoms.  Baseline: Shoulder Flex R/L 4+/4+, Shoulder Abd R/L 4+/4+, Elbow Flex R/L 4/4, Elbow Ext R/L 4/4, Wrist Ext  Goal status: INITIAL  3.  Patient will perform deep flexor endurance test of 29.4 sec to be within age and gender matched norms for healthy individuals to show improved cervical strength and to improve symptom response to UE activity.  Baseline: NT  Goal status: INITIAL  4. Patient will achieve a grip strength that is within standards for age and gender matched norms with 29 lbs for RUE and 28 lbs for LUE to carry out UE related tasks such as dressing and moving patients with less difficulty. Baseline: NT  Goal status: INITIAL     PLAN: PT FREQUENCY: 1-2x/week  PT DURATION: 10 weeks  PLANNED INTERVENTIONS: Therapeutic exercises, Therapeutic activity, Neuromuscular re-education, Balance training, Gait training, Patient/Family education, Self Care, Joint mobilization, Joint manipulation, Aquatic Therapy, Dry Needling, Electrical stimulation, Spinal manipulation, Spinal mobilization, Cryotherapy, Moist heat, Traction, Manual therapy, and Re-evaluation  PLAN FOR NEXT SESSION: Grip Strength, Deep Neck Flexor Endurance Test, Finish MMT.   Ellin Goodie PT, DPT  11/13/2021, 11:01 AM

## 2021-11-13 ENCOUNTER — Encounter: Payer: Self-pay | Admitting: Physical Therapy

## 2021-11-17 ENCOUNTER — Ambulatory Visit: Payer: Managed Care, Other (non HMO) | Admitting: Physical Therapy

## 2021-11-19 ENCOUNTER — Ambulatory Visit: Payer: Managed Care, Other (non HMO) | Admitting: Physical Therapy

## 2021-11-19 DIAGNOSIS — M542 Cervicalgia: Secondary | ICD-10-CM | POA: Diagnosis not present

## 2021-11-19 DIAGNOSIS — M5412 Radiculopathy, cervical region: Secondary | ICD-10-CM

## 2021-11-19 NOTE — Therapy (Signed)
OUTPATIENT PHYSICAL THERAPY TREATMENT NOTE   Patient Name: Jean Rios MRN: [id number] DOB:[birth year], 46 y.o., female Today's Date: 11/19/2021  PCP: Dr. Sampson Si  REFERRING PROVIDER: Dr. Sampson Si   END OF SESSION:   PT End of Session - 11/19/21 1414     Visit Number 2    Number of Visits 16    Date for PT Re-Evaluation 01/08/22    Authorization Type [employer] based on [place]    PT Start Time 1330    PT Stop Time 1415    PT Time Calculation (min) 45 min    Activity Tolerance Patient tolerated treatment well    Behavior During Therapy WFL for tasks assessed/performed             Past Medical History:  Diagnosis Date   Anemia    Anxiety    Chronic back pain    Depression    Fibromyalgia    Frequent headaches    Hyperlipidemia    Kidney stone    Nerve damage    POTS (postural orthostatic tachycardia syndrome)    Stroke [facility])    Past Surgical History:  Procedure Laterality Date   COLONOSCOPY WITH PROPOFOL N/A 07/10/2021   Procedure: COLONOSCOPY WITH PROPOFOL;  Surgeon: [name], MD;  Location: [facility] ENDOSCOPY;  Service: Gastroenterology;  Laterality: N/A;   ESOPHAGOGASTRODUODENOSCOPY N/A 07/10/2021   Procedure: ESOPHAGOGASTRODUODENOSCOPY (EGD);  Surgeon: [name], MD;  Location: [facility] ENDOSCOPY;  Service: Gastroenterology;  Laterality: N/A;   PARTIAL HYSTERECTOMY  2014   Patient Active Problem List   Diagnosis Date Noted   Overweight with body mass index (BMI) of 26 to 26.9 in adult 09/08/2021   Idiopathic urticaria 06/23/2021   Anxiety and depression 01/21/2021   Insomnia 01/21/2021   History of TIA (transient ischemic attack) 01/21/2021   Pure hypercholesterolemia 01/21/2021   Frequent headaches 01/21/2021   POTS (postural orthostatic tachycardia syndrome) 01/21/2021   Chronic back pain 01/21/2021   Fibromyalgia 01/21/2021    REFERRING DIAG: M54.2,G89.29 (ICD-10-CM) - Chronic neck pain  THERAPY DIAG:  Cervicalgia  Radiculopathy, cervical  region  Rationale for Evaluation and Treatment Rehabilitation  PERTINENT HISTORY: Per Dr. Janith Lima note on 10/27/21   Patient presents to clinic today for follow-up of multiple joint pain.  This has been an ongoing issue.  She reports most of the pain is located in her neck, shoulders, elbows, wrist, hips, knees, ankles and feet.  She describes the pain as pulsating and shooting.  She reports associated burning pain and numbness from her elbows to her wrist.  She reports some joint swelling but denies rash over these joints.  She denies redness or warmth.  She reports her joints are tender to touch.  She had a negative autoimmune work-up 09/2021.  She is interested in having x-rays at this time.  PRECAUTIONS: None   SUBJECTIVE: Patient reports that her pain becomes much worse when she sleeps on her left side. She feels worse today because she slept wrong on her left shoulder. She notes increased tenderness on the right shoulder blade. She had difficulty falling sleep last night but she does not think that necessarily correlates with increased symptoms.   PAIN:  Are you having pain? Yes: NPRS scale: 4/10 Pain location: Left Shoulder Blade  Pain description: Achy  Aggravating factors: Using left shoulder  Relieving factors: Not using the left shoulder    OBJECTIVE:   Vitals: BP 120/79 HR 64 SpO2 99           Red Flags:  Dizziness -, Diplopia +, Drop Attacks (loss of power or consciousness), Dysphagia -, Dysarthria, Nystagmus, Nausea or vomiting +, and other neurological symptoms        DIAGNOSTIC FINDINGS:  CLINICAL DATA:  Cervical spine pain   EXAM: CERVICAL SPINE - COMPLETE 4+ VIEW   COMPARISON:  None Available.   FINDINGS: There is no evidence of cervical spine fracture or prevertebral soft tissue swelling. Alignment is normal. Mild intervertebral disc space narrowing at C5-6. No focal bone lesion. Regional soft tissues are unremarkable   IMPRESSION: No acute finding in  the cervical spine. Mild degenerative disc disease at C5-6.     Electronically Signed   By: Emmaline Kluver M.D.   On: 10/27/2021 16:10   PATIENT SURVEYS:  FOTO 48/100 with 61 as target score      COGNITION: Overall cognitive status: Within functional limits for tasks assessed     SENSATION: Stocking glove pattern with burning, icy cold tingling that makes it painful to grip   POSTURE: No Significant postural limitations   PALPATION: TTP on cervical spinous process and upper trap             CERVICAL ROM:    Active ROM A/PROM (deg) eval  Flexion 45  Extension 45  Right lateral flexion 45  Left lateral flexion 40  Right rotation 60  Left rotation 55*   (Blank rows = not tested)   *Provocation of n/t down left arm into hand on all fingers    UPPER EXTREMITY ROM:        Active ROM Right eval Left eval  Shoulder flexion 180 180  Shoulder extension 60 60  Shoulder abduction 180  180  Shoulder adduction      Shoulder internal rotation 70 70  Shoulder external rotation 90 90  Elbow flexion 150 150  Elbow extension 60 60  Wrist flexion 80 80  Wrist extension 70 70  Wrist ulnar deviation 30 30  Wrist radial deviation 20 20  Wrist pronation      Wrist supination      (Blank rows = not tested)          UPPER EXTREMITY MMT:             Cervical Flexion 4+/5                          Ext: 4+/5                        R SB: 4/5                        L SB: 4/5                         R Rot: 4/5 *                        L Rot: 4/5*   *provocation of n/t down bilateral arms       MMT Right eval Left eval  Shoulder flexion 4+ 4+  Shoulder extension 5 5  Shoulder abduction 4+ 4+  Shoulder adduction      Shoulder extension 5 5  Shoulder internal rotation 5 5  Shoulder external rotation 5 5  Middle trapezius  3+  3+  Lower trapezius  3+ 3+   Elbow flexion 4 4  Elbow extension  4 4  Wrist flexion 4 4  Wrist extension 4- 4-  Wrist ulnar deviation       Wrist radial deviation      Wrist pronation      Wrist supination      Grip strength Good  Fair     (Blank rows = not tested)   CERVICAL SPECIAL TESTS:  Cranial cervical flexion test: Positive, Neck flexor muscle endurance test: 29.80, Upper limb tension test (ULTT):  , and Spurling's test: Pain and dizziness    Cervical Traction: + Bilateral      FUNCTIONAL TESTS: NT  TODAY'S TREATMENT:   11/19/21:  Cervical Retraction: + Bilateral  ULTT: + B with exacerbation with head SB   Grip Strength R/L 45 lbs/20 lbs  Deep Neck Flexor Endurance Test: 29.80 sec    Rhomboid Stretch 2 x 60 sec  -min to mod VC for hand placement  Seated Cervical Retraction 2 x 10   Initial: Upper Trap Stretch Left 2 x 30 sec   -min to mod VC to sequence steps to perform exercises      PATIENT EDUCATION:  Education details: form and technique for appropriate exercise and explanation of tests and measures  Person educated: Patient Education method: Programmer, multimedia, Demonstration, Verbal cues, and Handouts Education comprehension: verbalized understanding, returned demonstration, and verbal cues required     HOME EXERCISE PROGRAM: Access Code: [email or web address] URL: [email or web address] Date: 11/19/2021 Prepared by: Ellin Goodie  Exercises - Seated Upper Trapezius Stretch  - 1 x daily - 7 x weekly - 1 sets - 3 reps - 30 hold - Doorway Rhomboid Stretch  - 1 x daily - 7 x weekly - 1 sets - 3 reps - 30 hold - Seated Scapular Retraction  - 1 x daily - 3 x weekly - 3 sets - 10 reps   ASSESSMENT:   CLINICAL IMPRESSION:  Pt exhibits signs and symptoms of bilateral cervical radiculopathy with relief of symptoms with cervical traction and positive upper limb tension test. Clarity of diagnosis is complicated by potential for bilateral carpal tunnel that neurology is working her up for. Pt does not that nerve distribution tends to occur more in index middle and thumb fingers indicative of median  nerve involvement. Pt notes relief of left parascapular trigger point with use of theracane and soft tissue massage. She will benefit from skilled PT to increase cervical ROM and shoulder and parascapular and wrist strength to be able to carry out job related tasks using UE.     OBJECTIVE IMPAIRMENTS decreased ROM, decreased strength, impaired flexibility, impaired sensation, impaired UE functional use, and pain.    ACTIVITY LIMITATIONS carrying, lifting, bending, reach over head, hygiene/grooming, and caring for others   PARTICIPATION LIMITATIONS: cleaning, laundry, driving, and occupation   PERSONAL FACTORS Age, Past/current experiences, Profession, Time since onset of injury/illness/exacerbation, and 3+ comorbidities: fibromyalgia, POTS, chronic back pain, depression, anxiety  are also affecting patient's functional outcome.    REHAB POTENTIAL: Fair chronicity of deficits and psychological comorbidities, fibromyalgia     CLINICAL DECISION MAKING: Evolving/moderate complexity   EVALUATION COMPLEXITY: Moderate     GOALS: Goals reviewed with patient? No   SHORT TERM GOALS: Target date: 11/27/2021    Pt will be independent with HEP in order to improve strength and balance in order to decrease fall risk and improve function at home and work. Baseline: NT Goal status: ONGOING    2.  Pt will demonstrate understanding of graded exercises within acceptable pain range to be  more consistent with HEP.  Baseline: NT  Goal status: ONGOING        LONG TERM GOALS: Target date: 01/08/2022   Patient will have improved function and activity level as evidenced by an increase in FOTO score by 10 points or more.  Baseline: 48/100 with target of 61  Goal status: ONGOING    2.  Patient will improve wrist, shoulder, and parascapular strength by 1/2 MMT for improved cervical stability and reduced symptoms.  Baseline: Shoulder Flex R/L 4+/4+, Shoulder Abd R/L 4+/4+, Elbow Flex R/L 4/4, Elbow Ext R/L  4/4, Mid Trap 3+/3+, Lower Trap 3+/3+, Shoulder Ext 3+/3+   Goal status: ONGOING    3.  Patient will perform deep flexor endurance test of 29.4 sec to be within age and gender matched norms for healthy individuals to show improved cervical strength and to improve symptom response to UE activity.  Baseline: 29.80 sec   Goal status: ACHIEVED    4. Patient will achieve a grip strength that is within standards for age and gender matched norms with 29 lbs for RUE and 28 lbs for LUE to carry out UE related tasks such as dressing and moving patients with less difficulty. Baseline: Right 45 lbs and Left 20 lbs  Goal status: PARTIALLY ACHIEVED  5. Patient      PLAN: PT FREQUENCY: 1-2x/week   PT DURATION: 10 weeks   PLANNED INTERVENTIONS: Therapeutic exercises, Therapeutic activity, Neuromuscular re-education, Balance training, Gait training, Patient/Family education, Self Care, Joint mobilization, Joint manipulation, Aquatic Therapy, Dry Needling, Electrical stimulation, Spinal manipulation, Spinal mobilization, Cryotherapy, Moist heat, Traction, Manual therapy, and Re-evaluation   PLAN FOR NEXT SESSION:  Progress parascapular strengthening exercises. Inflatable cervical collar for distraction. Trigger point release of parascapular tissue    Ellin Goodie PT, DPT  11/19/2021, 2:25 PM

## 2021-11-24 ENCOUNTER — Ambulatory Visit: Payer: Managed Care, Other (non HMO) | Admitting: Physical Therapy

## 2021-11-26 ENCOUNTER — Ambulatory Visit: Payer: Managed Care, Other (non HMO) | Admitting: Physical Therapy

## 2021-12-03 ENCOUNTER — Ambulatory Visit: Payer: Managed Care, Other (non HMO) | Attending: Internal Medicine | Admitting: Physical Therapy

## 2021-12-03 ENCOUNTER — Encounter: Payer: Self-pay | Admitting: Physical Therapy

## 2021-12-03 DIAGNOSIS — M542 Cervicalgia: Secondary | ICD-10-CM | POA: Diagnosis present

## 2021-12-03 DIAGNOSIS — M5412 Radiculopathy, cervical region: Secondary | ICD-10-CM | POA: Diagnosis present

## 2021-12-03 NOTE — Therapy (Signed)
OUTPATIENT PHYSICAL THERAPY TREATMENT NOTE   Patient Name: Jean Rios MRN: [id number] DOB:1976/02/28, 46 y.o., female Today's Date: 12/03/2021  PCP: Dr. Sampson Si  REFERRING PROVIDER: Dr. Sampson Si   END OF SESSION:   PT End of Session - 12/03/21 1347     Visit Number 3    Number of Visits 16    Date for PT Re-Evaluation 01/08/22    Authorization Type [employer] [place] on [place]    PT Start Time 1345    PT Stop Time 1425    PT Time Calculation (min) 40 min    Activity Tolerance Patient tolerated treatment well    Behavior During Therapy WFL for tasks assessed/performed             Past Medical History:  Diagnosis Date   Anemia    Anxiety    Chronic back pain    Depression    Fibromyalgia    Frequent headaches    Hyperlipidemia    Kidney stone    Nerve damage    POTS (postural orthostatic tachycardia syndrome)    Stroke [facility])    Past Surgical History:  Procedure Laterality Date   COLONOSCOPY WITH PROPOFOL N/A 07/10/2021   Procedure: COLONOSCOPY WITH PROPOFOL;  Surgeon: [name], MD;  Location: [facility] ENDOSCOPY;  Service: Gastroenterology;  Laterality: N/A;   ESOPHAGOGASTRODUODENOSCOPY N/A 07/10/2021   Procedure: ESOPHAGOGASTRODUODENOSCOPY (EGD);  Surgeon: [name], MD;  Location: [facility] ENDOSCOPY;  Service: Gastroenterology;  Laterality: N/A;   PARTIAL HYSTERECTOMY  2014   Patient Active Problem List   Diagnosis Date Noted   Overweight with body mass index (BMI) of 26 to 26.9 in adult 09/08/2021   Idiopathic urticaria 06/23/2021   Anxiety and depression 01/21/2021   Insomnia 01/21/2021   History of TIA (transient ischemic attack) 01/21/2021   Pure hypercholesterolemia 01/21/2021   Frequent headaches 01/21/2021   POTS (postural orthostatic tachycardia syndrome) 01/21/2021   Chronic back pain 01/21/2021   Fibromyalgia 01/21/2021    REFERRING DIAG: M54.2,G89.29 (ICD-10-CM) - Chronic neck pain  THERAPY DIAG:  Cervicalgia  Radiculopathy, cervical  region  Rationale for Evaluation and Treatment Rehabilitation  PERTINENT HISTORY: Per Dr. Janith Lima note on 10/27/21   Patient presents to clinic today for follow-up of multiple joint pain.  This has been an ongoing issue.  She reports most of the pain is located in her neck, shoulders, elbows, wrist, hips, knees, ankles and feet.  She describes the pain as pulsating and shooting.  She reports associated burning pain and numbness from her elbows to her wrist.  She reports some joint swelling but denies rash over these joints.  She denies redness or warmth.  She reports her joints are tender to touch.  She had a negative autoimmune work-up 09/2021.  She is interested in having x-rays at this time.  PRECAUTIONS: None   SUBJECTIVE: Pt reports that her first three fingers tend to go numb when working on things placing pads on the head for EEG measurements.  She has also been having increased migraines since last session which she has been taking ibuprofen for.    PAIN:  Are you having pain? Yes: NPRS scale: 5/10 Pain location: Neck and Low Back  Pain description: Achy  Aggravating factors: Using left shoulder  Relieving factors: Not using the left shoulder    OBJECTIVE:   Vitals: BP 120/79 HR 64 SpO2 99           Red Flags:    Dizziness -, Diplopia +, Drop Attacks (loss of power  or consciousness), Dysphagia -, Dysarthria, Nystagmus, Nausea or vomiting +, and other neurological symptoms        DIAGNOSTIC FINDINGS:  CLINICAL DATA:  Cervical spine pain   EXAM: CERVICAL SPINE - COMPLETE 4+ VIEW   COMPARISON:  None Available.   FINDINGS: There is no evidence of cervical spine fracture or prevertebral soft tissue swelling. Alignment is normal. Mild intervertebral disc space narrowing at C5-6. No focal bone lesion. Regional soft tissues are unremarkable   IMPRESSION: No acute finding in the cervical spine. Mild degenerative disc disease at C5-6.     Electronically Signed   By:  Emmaline Kluver M.D.   On: 10/27/2021 16:10   PATIENT SURVEYS:  FOTO 48/100 with 61 as target score      COGNITION: Overall cognitive status: Within functional limits for tasks assessed     SENSATION: Stocking glove pattern with burning, icy cold tingling that makes it painful to grip   POSTURE: No Significant postural limitations   PALPATION: TTP on cervical spinous process and upper trap             CERVICAL ROM:    Active ROM A/PROM (deg) eval  Flexion 45  Extension 45  Right lateral flexion 45  Left lateral flexion 40  Right rotation 60  Left rotation 55*   (Blank rows = not tested)   *Provocation of n/t down left arm into hand on all fingers    UPPER EXTREMITY ROM:        Active ROM Right eval Left eval  Shoulder flexion 180 180  Shoulder extension 60 60  Shoulder abduction 180  180  Shoulder adduction      Shoulder internal rotation 70 70  Shoulder external rotation 90 90  Elbow flexion 150 150  Elbow extension 60 60  Wrist flexion 80 80  Wrist extension 70 70  Wrist ulnar deviation 30 30  Wrist radial deviation 20 20  Wrist pronation      Wrist supination      (Blank rows = not tested)          UPPER EXTREMITY MMT:             Cervical Flexion 4+/5                          Ext: 4+/5                        R SB: 4/5                        L SB: 4/5                         R Rot: 4/5 *                        L Rot: 4/5*   *provocation of n/t down bilateral arms       MMT Right eval Left eval  Shoulder flexion 4+ 4+  Shoulder extension 5 5  Shoulder abduction 4+ 4+  Shoulder adduction      Shoulder extension 5 5  Shoulder internal rotation 5 5  Shoulder external rotation 5 5  Middle trapezius  3+  3+  Lower trapezius  3+ 3+   Elbow flexion 4 4  Elbow extension 4 4  Wrist flexion 4 4  Wrist  extension 4- 4-  Wrist ulnar deviation      Wrist radial deviation      Wrist pronation      Wrist supination      Grip strength  Good  Fair     (Blank rows = not tested)   CERVICAL SPECIAL TESTS:  Cranial cervical flexion test: Positive, Neck flexor muscle endurance test: 29.80, Upper limb tension test (ULTT):  , and Spurling's test: Pain and dizziness    Cervical Traction: + Bilateral      FUNCTIONAL TESTS: NT  TODAY'S TREATMENT:   12/03/21:  UBE 7 seat and arms  STarT Back Screening: 4 pt total and 2 pt on Q5-Q9   Median Nerve Glides 2 x 10  -min to mod VC for sequence of exercises   Triceps Stretch 2 x 60 sec   Discussion about inflatable cervical traction collar and benefits: reviewed different options on [employer]   Manual Therapy:  Suboccipital Release  Cervical Traction  -Pt notes improvement in symptoms  Upper trap stretch 4 x 60 sec  Cervical up glides grade I and II x 30    11/19/21:  Cervical Retraction: + Bilateral  ULTT: + B with exacerbation with head SB   Grip Strength R/L 45 lbs/20 lbs  Deep Neck Flexor Endurance Test: 29.80 sec    Rhomboid Stretch 2 x 60 sec  -min to mod VC for hand placement  Seated Cervical Retraction 2 x 10   Initial: Upper Trap Stretch Left 2 x 30 sec   -min to mod VC to sequence steps to perform exercises      PATIENT EDUCATION:  Education details: form and technique for appropriate exercise and explanation of tests and measures  Person educated: Patient Education method: Programmer, multimedia, Demonstration, Verbal cues, and Handouts Education comprehension: verbalized understanding, returned demonstration, and verbal cues required     HOME EXERCISE PROGRAM: Access Code: [email or web address] URL: [email or web address] Date: 12/03/2021 Prepared by: Ellin Goodie  Exercises - Seated Upper Trapezius Stretch  - 1 x daily - 7 x weekly - 1 sets - 3 reps - 30 hold - Doorway Rhomboid Stretch  - 1 x daily - 7 x weekly - 1 sets - 3 reps - 30 hold - Seated Scapular Retraction  - 1 x daily - 3 x weekly - 3 sets - 10 reps - Median Nerve Flossing - Tray  - 1 x  daily - 7 x weekly - 1 sets - 10 reps  ASSESSMENT:   CLINICAL IMPRESSION:  Pt shows improvement in symptoms with cervical traction and that she is at a low risk for central sensitization of her pain and need for psychologically informed PT interventions. She was able to complete exercises during session with minor increase in her pain, but no overall increase in her cervical or low back pain. PT educated pt on use of cervical traction to provide relief of cervical radiculopathy. Increased cervical tension noted during manual work that resolved somewhat during massage and trigger point release. She will continue to benefit from skilled PT to increase cervical ROM and shoulder and parascapular and wrist strength to be able to carry out job related tasks using UE.  OBJECTIVE IMPAIRMENTS decreased ROM, decreased strength, impaired flexibility, impaired sensation, impaired UE functional use, and pain.    ACTIVITY LIMITATIONS carrying, lifting, bending, reach over head, hygiene/grooming, and caring for others   PARTICIPATION LIMITATIONS: cleaning, laundry, driving, and occupation   PERSONAL FACTORS Age, Past/current experiences, Profession, Time since onset of injury/illness/exacerbation, and  3+ comorbidities: fibromyalgia, POTS, chronic back pain, depression, anxiety  are also affecting patient's functional outcome.    REHAB POTENTIAL: Fair chronicity of deficits and psychological comorbidities, fibromyalgia     CLINICAL DECISION MAKING: Evolving/moderate complexity   EVALUATION COMPLEXITY: Moderate     GOALS: Goals reviewed with patient? No   SHORT TERM GOALS: Target date: 11/27/2021    Pt will be independent with HEP in order to improve strength and balance in order to decrease fall risk and improve function at home and work. Baseline: NT Goal status: ONGOING    2.  Pt will demonstrate understanding of graded exercises within acceptable pain range to be more consistent with HEP.   Baseline: NT  Goal status: ONGOING        LONG TERM GOALS: Target date: 01/08/2022   Patient will have improved function and activity level as evidenced by an increase in FOTO score by 10 points or more.  Baseline: 48/100 with target of 61  Goal status: ONGOING    2.  Patient will improve wrist, shoulder, and parascapular strength by 1/2 MMT for improved cervical stability and reduced symptoms.  Baseline: Shoulder Flex R/L 4+/4+, Shoulder Abd R/L 4+/4+, Elbow Flex R/L 4/4, Elbow Ext R/L 4/4, Mid Trap 3+/3+, Lower Trap 3+/3+, Shoulder Ext 3+/3+   Goal status: ONGOING    3.  Patient will perform deep flexor endurance test of 29.4 sec to be within age and gender matched norms for healthy individuals to show improved cervical strength and to improve symptom response to UE activity.  Baseline: 29.80 sec   Goal status: ACHIEVED    4. Patient will achieve a grip strength that is within standards for age and gender matched norms with 29 lbs for RUE and 28 lbs for LUE to carry out UE related tasks such as dressing and moving patients with less difficulty. Baseline: Right 45 lbs and Left 20 lbs  Goal status: PARTIALLY ACHIEVED  5. Patient      PLAN: PT FREQUENCY: 1-2x/week   PT DURATION: 10 weeks   PLANNED INTERVENTIONS: Therapeutic exercises, Therapeutic activity, Neuromuscular re-education, Balance training, Gait training, Patient/Family education, Self Care, Joint mobilization, Joint manipulation, Aquatic Therapy, Dry Needling, Electrical stimulation, Spinal manipulation, Spinal mobilization, Cryotherapy, Moist heat, Traction, Manual therapy, and Re-evaluation   PLAN FOR NEXT SESSION:  Progress parascapular strengthening exercises. Trigger point release of low back and cervical paraspinals    Ellin Goodie PT, DPT  12/03/2021, 1:48 PM

## 2021-12-08 ENCOUNTER — Ambulatory Visit: Payer: Managed Care, Other (non HMO) | Admitting: Physical Therapy

## 2021-12-10 ENCOUNTER — Ambulatory Visit: Payer: Managed Care, Other (non HMO) | Admitting: Physical Therapy

## 2021-12-12 ENCOUNTER — Encounter (HOSPITAL_COMMUNITY): Payer: Self-pay

## 2021-12-12 ENCOUNTER — Emergency Department (HOSPITAL_COMMUNITY): Payer: Managed Care, Other (non HMO)

## 2021-12-12 ENCOUNTER — Inpatient Hospital Stay (HOSPITAL_COMMUNITY)
Admission: EM | Admit: 2021-12-12 | Discharge: 2021-12-15 | DRG: 552 | Disposition: A | Discharge status: Home / Self Care | Payer: Managed Care, Other (non HMO) | Attending: Internal Medicine | Admitting: Internal Medicine

## 2021-12-12 ENCOUNTER — Other Ambulatory Visit: Payer: Self-pay

## 2021-12-12 DIAGNOSIS — M4802 Spinal stenosis, cervical region: Secondary | ICD-10-CM | POA: Diagnosis present

## 2021-12-12 DIAGNOSIS — Z79899 Other long term (current) drug therapy: Secondary | ICD-10-CM

## 2021-12-12 DIAGNOSIS — E7800 Pure hypercholesterolemia, unspecified: Secondary | ICD-10-CM | POA: Diagnosis present

## 2021-12-12 DIAGNOSIS — M50223 Other cervical disc displacement at C6-C7 level: Principal | ICD-10-CM | POA: Diagnosis present

## 2021-12-12 DIAGNOSIS — M545 Low back pain, unspecified: Secondary | ICD-10-CM

## 2021-12-12 DIAGNOSIS — Z87891 Personal history of nicotine dependence: Secondary | ICD-10-CM

## 2021-12-12 DIAGNOSIS — R262 Difficulty in walking, not elsewhere classified: Secondary | ICD-10-CM | POA: Diagnosis present

## 2021-12-12 DIAGNOSIS — G8929 Other chronic pain: Secondary | ICD-10-CM | POA: Diagnosis present

## 2021-12-12 DIAGNOSIS — M5126 Other intervertebral disc displacement, lumbar region: Secondary | ICD-10-CM | POA: Diagnosis present

## 2021-12-12 DIAGNOSIS — Z87442 Personal history of urinary calculi: Secondary | ICD-10-CM

## 2021-12-12 DIAGNOSIS — K5900 Constipation, unspecified: Secondary | ICD-10-CM | POA: Diagnosis not present

## 2021-12-12 DIAGNOSIS — G35 Multiple sclerosis: Secondary | ICD-10-CM | POA: Diagnosis present

## 2021-12-12 DIAGNOSIS — M5020 Other cervical disc displacement, unspecified cervical region: Secondary | ICD-10-CM

## 2021-12-12 DIAGNOSIS — Z833 Family history of diabetes mellitus: Secondary | ICD-10-CM

## 2021-12-12 DIAGNOSIS — F419 Anxiety disorder, unspecified: Secondary | ICD-10-CM | POA: Diagnosis present

## 2021-12-12 DIAGNOSIS — Z20822 Contact with and (suspected) exposure to covid-19: Secondary | ICD-10-CM | POA: Diagnosis present

## 2021-12-12 DIAGNOSIS — M542 Cervicalgia: Secondary | ICD-10-CM | POA: Diagnosis present

## 2021-12-12 DIAGNOSIS — Z8673 Personal history of transient ischemic attack (TIA), and cerebral infarction without residual deficits: Secondary | ICD-10-CM

## 2021-12-12 DIAGNOSIS — Y92410 Unspecified street and highway as the place of occurrence of the external cause: Secondary | ICD-10-CM

## 2021-12-12 DIAGNOSIS — K219 Gastro-esophageal reflux disease without esophagitis: Secondary | ICD-10-CM | POA: Diagnosis present

## 2021-12-12 DIAGNOSIS — F32A Depression, unspecified: Secondary | ICD-10-CM | POA: Diagnosis present

## 2021-12-12 DIAGNOSIS — M797 Fibromyalgia: Secondary | ICD-10-CM | POA: Diagnosis present

## 2021-12-12 DIAGNOSIS — M50222 Other cervical disc displacement at C5-C6 level: Secondary | ICD-10-CM | POA: Diagnosis present

## 2021-12-12 LAB — URINALYSIS, ROUTINE W REFLEX MICROSCOPIC
Bilirubin Urine: NEGATIVE
Glucose, UA: NEGATIVE mg/dL
Hgb urine dipstick: NEGATIVE
Ketones, ur: NEGATIVE mg/dL
Leukocytes,Ua: NEGATIVE
Nitrite: NEGATIVE
Protein, ur: NEGATIVE mg/dL
Specific Gravity, Urine: 1.012 (ref 1.005–1.030)
pH: 6 (ref 5.0–8.0)

## 2021-12-12 LAB — I-STAT CHEM 8, ED
BUN: 15 mg/dL (ref 6–20)
Calcium, Ion: 1.25 mmol/L (ref 1.15–1.40)
Chloride: 105 mmol/L (ref 98–111)
Creatinine, Ser: 0.8 mg/dL (ref 0.44–1.00)
Glucose, Bld: 92 mg/dL (ref 70–99)
HCT: 43 % (ref 36.0–46.0)
Hemoglobin: 14.6 g/dL (ref 12.0–15.0)
Potassium: 4 mmol/L (ref 3.5–5.1)
Sodium: 141 mmol/L (ref 135–145)
TCO2: 26 mmol/L (ref 22–32)

## 2021-12-12 LAB — I-STAT BETA HCG BLOOD, ED (MC, WL, AP ONLY): I-stat hCG, quantitative: 7.6 m[IU]/mL — ABNORMAL HIGH (ref <5)

## 2021-12-12 LAB — COMPREHENSIVE METABOLIC PANEL
ALT: 26 U/L (ref 0–44)
AST: 24 U/L (ref 15–41)
Albumin: 4.5 g/dL (ref 3.5–5.0)
Alkaline Phosphatase: 81 U/L (ref 38–126)
Anion gap: 7 (ref 5–15)
BUN: 15 mg/dL (ref 6–20)
CO2: 26 mmol/L (ref 22–32)
Calcium: 9.5 mg/dL (ref 8.9–10.3)
Chloride: 105 mmol/L (ref 98–111)
Creatinine, Ser: 0.89 mg/dL (ref 0.44–1.00)
GFR, Estimated: >60 mL/min (ref >60)
Glucose, Bld: 93 mg/dL (ref 70–99)
Potassium: 3.9 mmol/L (ref 3.5–5.1)
Sodium: 138 mmol/L (ref 135–145)
Total Bilirubin: 1.1 mg/dL (ref 0.3–1.2)
Total Protein: 7.3 g/dL (ref 6.5–8.1)

## 2021-12-12 LAB — CBC
HCT: 41.6 % (ref 36.0–46.0)
Hemoglobin: 14.1 g/dL (ref 12.0–15.0)
MCH: 29.9 pg (ref 26.0–34.0)
MCHC: 33.9 g/dL (ref 30.0–36.0)
MCV: 88.3 fL (ref 80.0–100.0)
Platelets: 282 10*3/uL (ref 150–400)
RBC: 4.71 MIL/uL (ref 3.87–5.11)
RDW: 11.9 % (ref 11.5–15.5)
WBC: 6.5 10*3/uL (ref 4.0–10.5)
nRBC: 0 % (ref 0.0–0.2)

## 2021-12-12 MED ORDER — ONDANSETRON HCL 4 MG/2ML IJ SOLN
4.0000 mg | Freq: Once | INTRAMUSCULAR | Status: AC
  Administered 2021-12-12: 4 mg via INTRAVENOUS
  Filled 2021-12-12: qty 2

## 2021-12-12 MED ORDER — OXYCODONE-ACETAMINOPHEN 5-325 MG PO TABS
1.0000 | ORAL_TABLET | Freq: Once | ORAL | Status: AC
  Administered 2021-12-12: 1 via ORAL
  Filled 2021-12-12: qty 1

## 2021-12-12 MED ORDER — MORPHINE SULFATE (PF) 4 MG/ML IV SOLN
4.0000 mg | Freq: Once | INTRAVENOUS | Status: AC
  Administered 2021-12-12: 4 mg via INTRAVENOUS
  Filled 2021-12-12: qty 1

## 2021-12-12 MED ORDER — LORAZEPAM 1 MG PO TABS
1.0000 mg | ORAL_TABLET | Freq: Once | ORAL | Status: AC
  Administered 2021-12-12: 1 mg via ORAL
  Filled 2021-12-12: qty 1

## 2021-12-12 MED ORDER — SODIUM CHLORIDE 0.9 % IV BOLUS
1000.0000 mL | Freq: Once | INTRAVENOUS | Status: AC
  Administered 2021-12-12: 1000 mL via INTRAVENOUS

## 2021-12-12 MED ORDER — KETOROLAC TROMETHAMINE 30 MG/ML IJ SOLN
15.0000 mg | Freq: Once | INTRAMUSCULAR | Status: AC
  Administered 2021-12-12: 15 mg via INTRAVENOUS
  Filled 2021-12-12: qty 1

## 2021-12-12 MED ORDER — SODIUM CHLORIDE 0.9 % IV SOLN: INTRAVENOUS | Status: DC

## 2021-12-12 MED ORDER — IOHEXOL 300 MG/ML  SOLN
100.0000 mL | Freq: Once | INTRAMUSCULAR | Status: AC | PRN
  Administered 2021-12-12: 100 mL via INTRAVENOUS

## 2021-12-12 NOTE — ED Notes (Signed)
Patient transported to CT 

## 2021-12-12 NOTE — ED Notes (Signed)
Pt ambulated with assistance from this RN and pts husband. Pt gait slow and unsteady, reported dizziness while ambulating. Pt stated she felt like her legs were "giving out". Pt assisted back to bed. Rancour MD made aware
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
## 2021-12-12 NOTE — ED Notes (Signed)
Patient transported to MRI 

## 2021-12-12 NOTE — ED Notes (Signed)
Pt given water and tolerated well

## 2021-12-13 ENCOUNTER — Encounter (HOSPITAL_COMMUNITY): Payer: Self-pay | Admitting: Internal Medicine

## 2021-12-13 DIAGNOSIS — K219 Gastro-esophageal reflux disease without esophagitis: Secondary | ICD-10-CM

## 2021-12-13 DIAGNOSIS — R262 Difficulty in walking, not elsewhere classified: Secondary | ICD-10-CM

## 2021-12-13 LAB — RESP PANEL BY RT-PCR (FLU A&B, COVID) ARPGX2
Influenza A by PCR: NEGATIVE
Influenza B by PCR: NEGATIVE
SARS Coronavirus 2 by RT PCR: NEGATIVE

## 2021-12-13 LAB — CBC WITH DIFFERENTIAL/PLATELET
Abs Immature Granulocytes: 0.02 10*3/uL (ref 0.00–0.07)
Basophils Absolute: 0 10*3/uL (ref 0.0–0.1)
Basophils Relative: 1 %
Eosinophils Absolute: 0.2 10*3/uL (ref 0.0–0.5)
Eosinophils Relative: 4 %
HCT: 37.9 % (ref 36.0–46.0)
Hemoglobin: 12.5 g/dL (ref 12.0–15.0)
Immature Granulocytes: 0 %
Lymphocytes Relative: 39 %
Lymphs Abs: 2.3 10*3/uL (ref 0.7–4.0)
MCH: 29.8 pg (ref 26.0–34.0)
MCHC: 33 g/dL (ref 30.0–36.0)
MCV: 90.5 fL (ref 80.0–100.0)
Monocytes Absolute: 0.7 10*3/uL (ref 0.1–1.0)
Monocytes Relative: 11 %
Neutro Abs: 2.8 10*3/uL (ref 1.7–7.7)
Neutrophils Relative %: 45 %
Platelets: 268 10*3/uL (ref 150–400)
RBC: 4.19 MIL/uL (ref 3.87–5.11)
RDW: 12.2 % (ref 11.5–15.5)
WBC: 6 10*3/uL (ref 4.0–10.5)
nRBC: 0 % (ref 0.0–0.2)

## 2021-12-13 LAB — COMPREHENSIVE METABOLIC PANEL
ALT: 23 U/L (ref 0–44)
AST: 23 U/L (ref 15–41)
Albumin: 3.4 g/dL — ABNORMAL LOW (ref 3.5–5.0)
Alkaline Phosphatase: 63 U/L (ref 38–126)
Anion gap: 5 (ref 5–15)
BUN: 12 mg/dL (ref 6–20)
CO2: 26 mmol/L (ref 22–32)
Calcium: 8.6 mg/dL — ABNORMAL LOW (ref 8.9–10.3)
Chloride: 108 mmol/L (ref 98–111)
Creatinine, Ser: 0.9 mg/dL (ref 0.44–1.00)
GFR, Estimated: >60 mL/min (ref >60)
Glucose, Bld: 119 mg/dL — ABNORMAL HIGH (ref 70–99)
Potassium: 3.5 mmol/L (ref 3.5–5.1)
Sodium: 139 mmol/L (ref 135–145)
Total Bilirubin: 0.3 mg/dL (ref 0.3–1.2)
Total Protein: 5.9 g/dL — ABNORMAL LOW (ref 6.5–8.1)

## 2021-12-13 MED ORDER — MORPHINE SULFATE (PF) 4 MG/ML IV SOLN: 4.0000 mg | INTRAVENOUS | Status: DC | PRN

## 2021-12-13 MED ORDER — MORPHINE SULFATE (PF) 2 MG/ML IV SOLN
4.0000 mg | INTRAVENOUS | Status: DC | PRN
  Administered 2021-12-13 – 2021-12-14 (×2): 4 mg via INTRAVENOUS
  Filled 2021-12-13 (×2): qty 2

## 2021-12-13 MED ORDER — LORATADINE 10 MG PO TABS
10.0000 mg | ORAL_TABLET | Freq: Every evening | ORAL | Status: DC
Start: 2021-12-13 — End: 2021-12-14
  Administered 2021-12-13 – 2021-12-14 (×2): 10 mg via ORAL
  Filled 2021-12-13 (×2): qty 1

## 2021-12-13 MED ORDER — SERTRALINE HCL 50 MG PO TABS
150.0000 mg | ORAL_TABLET | Freq: Every day | ORAL | Status: DC
  Administered 2021-12-13 – 2021-12-15 (×3): 150 mg via ORAL
  Filled 2021-12-13 (×3): qty 1

## 2021-12-13 MED ORDER — DIPHENHYDRAMINE HCL 50 MG/ML IJ SOLN
25.0000 mg | Freq: Four times a day (QID) | INTRAMUSCULAR | Status: DC | PRN
  Administered 2021-12-13: 25 mg via INTRAMUSCULAR
  Filled 2021-12-13: qty 1

## 2021-12-13 MED ORDER — LORATADINE 10 MG PO TABS
10.0000 mg | ORAL_TABLET | Freq: Every evening | ORAL | Status: DC
Start: 2021-12-13 — End: 2021-12-13

## 2021-12-13 MED ORDER — OXYCODONE HCL 5 MG PO TABS
5.0000 mg | ORAL_TABLET | Freq: Four times a day (QID) | ORAL | Status: DC | PRN
  Administered 2021-12-13 – 2021-12-15 (×5): 5 mg via ORAL
  Filled 2021-12-13 (×5): qty 1

## 2021-12-13 MED ORDER — ATORVASTATIN CALCIUM 10 MG PO TABS
20.0000 mg | ORAL_TABLET | Freq: Every day | ORAL | Status: DC
  Administered 2021-12-13 – 2021-12-15 (×3): 20 mg via ORAL
  Filled 2021-12-13 (×3): qty 2

## 2021-12-13 MED ORDER — DIPHENHYDRAMINE HCL 25 MG PO CAPS
25.0000 mg | ORAL_CAPSULE | Freq: Four times a day (QID) | ORAL | Status: DC | PRN
  Administered 2021-12-14: 25 mg via ORAL
  Filled 2021-12-13: qty 1

## 2021-12-13 MED ORDER — ACETAMINOPHEN 650 MG RE SUPP: 650.0000 mg | Freq: Four times a day (QID) | RECTAL | Status: DC | PRN

## 2021-12-13 MED ORDER — LORAZEPAM 2 MG/ML IJ SOLN
1.0000 mg | Freq: Once | INTRAMUSCULAR | Status: AC
  Administered 2021-12-14: 1 mg via INTRAVENOUS
  Filled 2021-12-13: qty 1

## 2021-12-13 MED ORDER — OXYCODONE-ACETAMINOPHEN 5-325 MG PO TABS
1.0000 | ORAL_TABLET | Freq: Four times a day (QID) | ORAL | Status: DC | PRN
  Administered 2021-12-13 – 2021-12-15 (×4): 1 via ORAL
  Filled 2021-12-13 (×4): qty 1

## 2021-12-13 MED ORDER — OXYCODONE-ACETAMINOPHEN 5-325 MG PO TABS
1.0000 | ORAL_TABLET | Freq: Once | ORAL | Status: AC
  Administered 2021-12-13: 1 via ORAL
  Filled 2021-12-13: qty 1

## 2021-12-13 MED ORDER — IBUPROFEN 200 MG PO TABS
600.0000 mg | ORAL_TABLET | Freq: Once | ORAL | Status: AC
  Administered 2021-12-13: 600 mg via ORAL
  Filled 2021-12-13: qty 1

## 2021-12-13 MED ORDER — ACETAMINOPHEN 325 MG PO TABS: 650.0000 mg | ORAL_TABLET | Freq: Four times a day (QID) | ORAL | Status: DC | PRN

## 2021-12-13 NOTE — ED Notes (Signed)
Pt resting comfortably, respirations even and unlabored. Pts family updated regarding pts status

## 2021-12-13 NOTE — Progress Notes (Signed)
Patient arrived from ED via stretcher; C-collar in place; patient is alert and oriented; rates pain 6 out of 0-10; oriented to room and unit routine; patient was transferred off the stretcher by staff members unable to stand and limited movement of Left lower extremity; reports numbness and tingling in lower body; spouse and daughter are at the bedside; per [facility] patient awaiting Neurosurgery consult.

## 2021-12-13 NOTE — ED Notes (Signed)
Pt requesting pain medication other than tylenol. MD paged.
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
## 2021-12-13 NOTE — Discharge Instructions (Addendum)
Follow-up with your local physician as needed and neurology and neurosurgery.  No lifting over 20 pounds until cleared by physician. Use ice, Tylenol and ibuprofen as needed for pain. For muscle spasm you can use Flexeril however can make you sleepy so no driving or operating machinery.  MRI result lumbar No acute abnormality within the lumbar spine.  2. Shallow left foraminal to extraforaminal disc protrusion at L3-4,  closely approximating and potentially irritating the exiting left L3  nerve root.  3. Mild disc bulge at L4-5 with resultant mild bilateral L4  foraminal stenosis.      COMPARISON:  CT from 12/12/2021.   IMPRESSION:  1. Patchy signal abnormality involving the right cord at the level  of C2, indeterminate, but could reflect edema and/or myelomalacia.  No significant stenosis at this level.  2. Right paracentral disc protrusion at C5-6 with resultant mild  spinal stenosis, with mild to moderate right C6 foraminal narrowing.  3. Small left paracentral disc protrusion at C6-7 with resultant  mild spinal stenosis.  4. Central disc protrusion at C4-5 with resultant mild spinal  stenosis.

## 2021-12-13 NOTE — Progress Notes (Signed)
Spoke with neurology (Dr. Otelia Limes); recommended adding MRI brain and calling them to interpret. MRI brain ordered.   Teddy Spike, DO
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
## 2021-12-13 NOTE — Progress Notes (Signed)
PT Cancellation Note  Patient Details Name: Jean Rios MRN: [id number] DOB: 02/22/1976   Cancelled Treatment:    Reason Eval/Treat Not Completed: Other (comment). PT awaiting neurosurgery recommendations prior to evaluation. PT will follow up later today.  Arlyss Gandy 12/13/2021, 10:07 AM

## 2021-12-13 NOTE — Progress Notes (Addendum)
After [facility] admission. See H&P for full details.  PE: General: 46 y.o. female resting in bed in NAD Cardiovascular: RRR, +S1, S2, no m/g/r, equal pulses throughout Respiratory: CTABL, no w/r/r, normal WOB GI: BS+, NDNT, no masses noted, no organomegaly noted MSK: No e/c/c  A/P: Neck pain BLE weakness, impaired ambulation HLD Anxiety/depression GERD  - MRI showing possible C2 edema vs/ myelomalacia. Also notable for disc protrusions. PT eval ordered and to occur after neurosurgery eval. Neurosurgery consulted in ED. Dr. Maurice Small to see. Appreciate assistance. Remainder per H&P.   Teddy Spike, DO
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
## 2021-12-13 NOTE — ED Notes (Addendum)
ED provider at bedside.

## 2021-12-13 NOTE — ED Notes (Signed)
Pt unable to ambulate without assistance. Pt tearful and expresses concern regarding going home. Zavitz MD made aware

## 2021-12-14 ENCOUNTER — Observation Stay (HOSPITAL_COMMUNITY): Payer: Managed Care, Other (non HMO)

## 2021-12-14 DIAGNOSIS — M4802 Spinal stenosis, cervical region: Secondary | ICD-10-CM | POA: Diagnosis present

## 2021-12-14 DIAGNOSIS — G35 Multiple sclerosis: Secondary | ICD-10-CM | POA: Diagnosis present

## 2021-12-14 DIAGNOSIS — Z87891 Personal history of nicotine dependence: Secondary | ICD-10-CM | POA: Diagnosis not present

## 2021-12-14 DIAGNOSIS — Z79899 Other long term (current) drug therapy: Secondary | ICD-10-CM | POA: Diagnosis not present

## 2021-12-14 DIAGNOSIS — F419 Anxiety disorder, unspecified: Secondary | ICD-10-CM | POA: Diagnosis present

## 2021-12-14 DIAGNOSIS — Z8673 Personal history of transient ischemic attack (TIA), and cerebral infarction without residual deficits: Secondary | ICD-10-CM | POA: Diagnosis not present

## 2021-12-14 DIAGNOSIS — F32A Depression, unspecified: Secondary | ICD-10-CM

## 2021-12-14 DIAGNOSIS — M5020 Other cervical disc displacement, unspecified cervical region: Secondary | ICD-10-CM | POA: Diagnosis present

## 2021-12-14 DIAGNOSIS — Z87442 Personal history of urinary calculi: Secondary | ICD-10-CM | POA: Diagnosis not present

## 2021-12-14 DIAGNOSIS — R262 Difficulty in walking, not elsewhere classified: Secondary | ICD-10-CM | POA: Diagnosis not present

## 2021-12-14 DIAGNOSIS — M797 Fibromyalgia: Secondary | ICD-10-CM | POA: Diagnosis present

## 2021-12-14 DIAGNOSIS — M50222 Other cervical disc displacement at C5-C6 level: Secondary | ICD-10-CM | POA: Diagnosis present

## 2021-12-14 DIAGNOSIS — E7800 Pure hypercholesterolemia, unspecified: Secondary | ICD-10-CM

## 2021-12-14 DIAGNOSIS — K219 Gastro-esophageal reflux disease without esophagitis: Secondary | ICD-10-CM | POA: Diagnosis present

## 2021-12-14 DIAGNOSIS — M5126 Other intervertebral disc displacement, lumbar region: Secondary | ICD-10-CM | POA: Diagnosis present

## 2021-12-14 DIAGNOSIS — Z833 Family history of diabetes mellitus: Secondary | ICD-10-CM | POA: Diagnosis not present

## 2021-12-14 DIAGNOSIS — K5900 Constipation, unspecified: Secondary | ICD-10-CM | POA: Diagnosis not present

## 2021-12-14 DIAGNOSIS — M50223 Other cervical disc displacement at C6-C7 level: Secondary | ICD-10-CM | POA: Diagnosis present

## 2021-12-14 DIAGNOSIS — M542 Cervicalgia: Secondary | ICD-10-CM | POA: Diagnosis present

## 2021-12-14 DIAGNOSIS — Y92410 Unspecified street and highway as the place of occurrence of the external cause: Secondary | ICD-10-CM | POA: Diagnosis not present

## 2021-12-14 DIAGNOSIS — G8929 Other chronic pain: Secondary | ICD-10-CM | POA: Diagnosis present

## 2021-12-14 DIAGNOSIS — Z20822 Contact with and (suspected) exposure to covid-19: Secondary | ICD-10-CM | POA: Diagnosis present

## 2021-12-14 LAB — BASIC METABOLIC PANEL
Anion gap: 6 (ref 5–15)
BUN: 13 mg/dL (ref 6–20)
CO2: 27 mmol/L (ref 22–32)
Calcium: 8.8 mg/dL — ABNORMAL LOW (ref 8.9–10.3)
Chloride: 108 mmol/L (ref 98–111)
Creatinine, Ser: 1.07 mg/dL — ABNORMAL HIGH (ref 0.44–1.00)
GFR, Estimated: >60 mL/min (ref >60)
Glucose, Bld: 98 mg/dL (ref 70–99)
Potassium: 3.9 mmol/L (ref 3.5–5.1)
Sodium: 141 mmol/L (ref 135–145)

## 2021-12-14 LAB — CBC
HCT: 35.5 % — ABNORMAL LOW (ref 36.0–46.0)
Hemoglobin: 11.8 g/dL — ABNORMAL LOW (ref 12.0–15.0)
MCH: 30.2 pg (ref 26.0–34.0)
MCHC: 33.2 g/dL (ref 30.0–36.0)
MCV: 90.8 fL (ref 80.0–100.0)
Platelets: 241 10*3/uL (ref 150–400)
RBC: 3.91 MIL/uL (ref 3.87–5.11)
RDW: 12 % (ref 11.5–15.5)
WBC: 5.4 10*3/uL (ref 4.0–10.5)
nRBC: 0 % (ref 0.0–0.2)

## 2021-12-14 MED ORDER — DOCUSATE SODIUM 100 MG PO CAPS
100.0000 mg | ORAL_CAPSULE | Freq: Two times a day (BID) | ORAL | Status: DC
  Administered 2021-12-14 – 2021-12-15 (×3): 100 mg via ORAL
  Filled 2021-12-14 (×3): qty 1

## 2021-12-14 MED ORDER — LEVOCETIRIZINE DIHYDROCHLORIDE 5 MG PO TABS
5.0000 mg | ORAL_TABLET | Freq: Every day | ORAL | Status: DC
  Filled 2021-12-14: qty 1

## 2021-12-14 MED ORDER — LEVOCETIRIZINE DIHYDROCHLORIDE 5 MG PO TABS
5.0000 mg | ORAL_TABLET | Freq: Every evening | ORAL | Status: DC
  Administered 2021-12-14: 5 mg via ORAL
  Filled 2021-12-14: qty 1

## 2021-12-14 MED ORDER — ALBUTEROL SULFATE (2.5 MG/3ML) 0.083% IN NEBU
2.5000 mg | INHALATION_SOLUTION | RESPIRATORY_TRACT | Status: DC | PRN
  Administered 2021-12-14: 2.5 mg via RESPIRATORY_TRACT
  Filled 2021-12-14: qty 3

## 2021-12-14 MED ORDER — LEVOCETIRIZINE DIHYDROCHLORIDE 5 MG PO TABS
5.0000 mg | ORAL_TABLET | Freq: Every evening | ORAL | Status: DC
  Filled 2021-12-14: qty 1

## 2021-12-14 MED ORDER — GADOBUTROL 1 MMOL/ML IV SOLN
7.2000 mL | Freq: Once | INTRAVENOUS | Status: AC | PRN
  Administered 2021-12-14: 7.2 mL via INTRAVENOUS
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
## 2021-12-14 NOTE — Progress Notes (Signed)
PT Cancellation Note  Patient Details Name: Annetta Deiss MRN: [id number] DOB: 1975-07-29   Cancelled Treatment:    Reason Eval/Treat Not Completed: Other (comment).  Pt is sleeping soundly, had ativan for MRI earlier today, visitors in room ok with me letting her rest and checking back this PM.   Corinna Capra, PT, DPT  Acute Rehabilitation Secure chat is best for contact #[phone] office    Rollene Rotunda Caffie Sotto 12/14/2021, 12:10 PM
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
## 2021-12-14 NOTE — Progress Notes (Signed)
[facility] night cross cover note:  Following for MRI brain, as previously ordered. Imaging pending. I touched base with patient's RN, who requested Ativan prior to MRI imaging. Order placed for Ativan 1 mg IV x 1, to be administered 30 mins prior to MRI brain. Plan remains for Hospitalist to touch base with neuro after MRI brain obtained so that neuro can personally evaluate imaging study for any subtle changes that may suggest MS.     Newton Pigg, DO Hospitalist

## 2021-12-15 DIAGNOSIS — K219 Gastro-esophageal reflux disease without esophagitis: Secondary | ICD-10-CM

## 2021-12-15 DIAGNOSIS — M542 Cervicalgia: Secondary | ICD-10-CM

## 2021-12-15 DIAGNOSIS — F419 Anxiety disorder, unspecified: Secondary | ICD-10-CM | POA: Diagnosis not present

## 2021-12-15 DIAGNOSIS — R262 Difficulty in walking, not elsewhere classified: Secondary | ICD-10-CM | POA: Diagnosis not present

## 2021-12-15 MED ORDER — CYCLOBENZAPRINE HCL 5 MG PO TABS: 5.0000 mg | ORAL_TABLET | Freq: Three times a day (TID) | ORAL | 0 refills | Status: DC | PRN

## 2021-12-15 MED ORDER — IBUPROFEN 400 MG PO TABS: 400.0000 mg | ORAL_TABLET | Freq: Four times a day (QID) | ORAL | 0 refills | Status: AC | PRN

## 2021-12-15 NOTE — Discharge Summary (Signed)
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  The SARS-CoV-2 RNA is generally detectable in upper respiratory specimens during the acute phase of infection. The lowest concentration of SARS-CoV-2 viral copies this assay can detect is 138 copies/mL. A negative result does not preclude SARS-Cov-2 infection and should not be used as the sole basis for treatment or other patient management decisions. A negative result may occur with  improper specimen collection/handling, submission of specimen other than nasopharyngeal swab, presence of viral mutation(s) within the areas targeted by this assay, and inadequate number of viral copies(<138 copies/mL). A negative result must be combined with clinical observations, patient history, and epidemiological information. The expected result is Negative.  Fact Sheet for Patients:  BloggerCourse.com  Fact Sheet for Healthcare Providers:  SeriousBroker.it  This test is no t yet approved or cleared by the [place] FDA and  has been authorized for detection and/or diagnosis of SARS-CoV-2  by FDA under an Emergency Use Authorization (EUA). This EUA will remain  in effect (meaning this test can be used) for the duration of the COVID-19 declaration under Section 564(b)(1) of the Act, 21 U.S.C.section 360bbb-3(b)(1), unless the authorization is terminated  or revoked sooner.       Influenza A by PCR NEGATIVE NEGATIVE Final   Influenza B by PCR NEGATIVE NEGATIVE Final    Comment: (NOTE) The Xpert Xpress SARS-CoV-2/FLU/RSV plus assay is intended as an aid in the diagnosis of influenza from Nasopharyngeal swab specimens and should not be used as a sole basis for treatment. Nasal washings and aspirates are unacceptable for Xpert Xpress SARS-CoV-2/FLU/RSV testing.  Fact Sheet for Patients: BloggerCourse.com  Fact Sheet for Healthcare Providers: SeriousBroker.it  This test is not yet approved or cleared by the [place] FDA and has been authorized for detection and/or diagnosis of SARS-CoV-2 by FDA under an Emergency Use Authorization (EUA). This EUA will remain in effect (meaning this test can be used) for the duration of the COVID-19 declaration under Section 564(b)(1) of the Act, 21 U.S.C. section 360bbb-3(b)(1), unless the authorization is terminated or revoked.  Performed at [facility], [address] [place], [place] [address]     Labs: CBC: Recent Labs  Lab 12/12/21 1924 12/12/21 1940 12/13/21 0943 12/14/21 0157  WBC 6.5  --  6.0 5.4  NEUTROABS  --   --  2.8  --   HGB 14.1 14.6 12.5 11.8*  HCT 41.6 43.0 37.9 35.5*  MCV 88.3  --  90.5 90.8  PLT 282  --  268 241   Basic Metabolic Panel: Recent Labs  Lab 12/12/21 1924 12/12/21 1940 12/13/21 1439 12/14/21 0157  NA 138 141 139 141  K 3.9 4.0 3.5 3.9  CL 105 105 108 108  CO2 26  --  26 27  GLUCOSE 93 92 119* 98  BUN 15 15 12 13   CREATININE 0.89 0.80 0.90 1.07*  CALCIUM 9.5  --  8.6* 8.8*   Liver Function Tests: Recent Labs  Lab  12/12/21 1924 12/13/21 1439  AST 24 23  ALT 26 23  ALKPHOS 81 63  BILITOT 1.1 0.3  PROT 7.3 5.9*  ALBUMIN 4.5 3.4*   CBG: No results for input(s): "GLUCAP" in the last 168 hours.  Discharge time spent: greater than 30 minutes.  Signed: Joycelyn Das, MD [facility] 12/15/2021
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
## 2021-12-15 NOTE — Progress Notes (Signed)
Nsg Discharge Note  Admit Date:  12/12/2021 Discharge date: 12/15/2021   Jean Rios to be D/C'd Home per MD order.  AVS completed. Patient/caregiver able to verbalize understanding.  Discharge Medication: Allergies as of 12/15/2021       Reactions   Duragesic-100 [fentanyl] Shortness Of Breath, Other (See Comments)   Chest tightness Passes out "Feels like dying"   Dust Mite Extract Hives, Itching   Mixed Feathers Itching        Medication List     TAKE these medications    atorvastatin 20 MG tablet Commonly known as: LIPITOR Take 1 tablet (20 mg total) by mouth daily.   cyclobenzaprine 5 MG tablet Commonly known as: FLEXERIL Take 1 tablet (5 mg total) by mouth 3 (three) times daily as needed for muscle spasms.   ibuprofen 400 MG tablet Commonly known as: ADVIL Take 1 tablet (400 mg total) by mouth every 6 (six) hours as needed for up to 8 days for headache or moderate pain (migraine). What changed:  medication strength when to take this reasons to take this   levocetirizine 5 MG tablet Commonly known as: XYZAL Take 5 mg by mouth every evening.   mometasone 0.1 % cream Commonly known as: ELOCON Apply 1 Application topically at bedtime as needed (break through hives).   omeprazole 40 MG capsule Commonly known as: PRILOSEC Take 40 mg by mouth 2 (two) times daily as needed (heartburn).   ONE-A-DAY WOMENS PO Take 1 tablet by mouth daily.   sertraline 100 MG tablet Commonly known as: ZOLOFT Take 1.5 tablets (150 mg total) by mouth daily.   VITAMIN D-3 PO Take 1 capsule by mouth daily.               Durable Medical Equipment  (From admission, onward)           Start     Ordered   12/15/21 1133  For home use only DME Walker rolling  Once       Question Answer Comment  Walker: With 5 Inch Wheels   Patient needs a walker to treat with the following condition Ambulatory dysfunction      12/15/21 1132   12/15/21 1132  For home use only  DME 3 n 1  Once        12/15/21 1132   12/15/21 1132  For home use only DME wheelchair cushion (seat and back)  Once        12/15/21 1132   12/15/21 1132  For home use only DME standard manual wheelchair with seat cushion  Once       Comments: Patient suffers from ambulatory dysfunction which impairs their ability to perform daily activities like bathing, grooming, and toileting in the home.  A walker will not resolve issue with performing activities of daily living. A wheelchair will allow patient to safely perform daily activities. Patient can safely propel the wheelchair in the home or has a caregiver who can provide assistance. Length of need 6 months . Accessories: elevating leg rests (ELRs), wheel locks, extensions and anti-tippers.   12/15/21 1132            Discharge Assessment: Vitals:   12/15/21 0755 12/15/21 1124  BP: 101/65 101/63  Pulse: 77 67  Resp: 18 16  Temp: 98.2 F (36.8 C) 98.5 F (36.9 C)  SpO2: 95% 96%   Skin clean, dry and intact without evidence of skin break down, no evidence of skin tears noted. IV catheter discontinued intact.  Site without signs and symptoms of complications - no redness or edema noted at insertion site, patient denies c/o pain - only slight tenderness at site.  Dressing with slight pressure applied.  D/c Instructions-Education: Discharge instructions given to patient/family with verbalized understanding. D/c education completed with patient/family including follow up instructions, medication list, d/c activities limitations if indicated, with other d/c instructions as indicated by MD - patient able to verbalize understanding, all questions fully answered. Patient instructed to return to ED, call 911, or call MD for any changes in condition.  Patient escorted via WC, and D/C home via private auto.  Kizzie Bane, RN 12/15/2021 1:21 PM
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
## 2021-12-16 ENCOUNTER — Ambulatory Visit: Payer: Managed Care, Other (non HMO) | Admitting: Physical Therapy

## 2021-12-18 ENCOUNTER — Encounter: Payer: Self-pay | Admitting: Neurology

## 2021-12-22 ENCOUNTER — Ambulatory Visit: Payer: Managed Care, Other (non HMO) | Admitting: Physical Therapy

## 2021-12-30 ENCOUNTER — Encounter: Payer: Self-pay | Admitting: Internal Medicine

## 2021-12-31 ENCOUNTER — Encounter: Payer: Self-pay | Admitting: Internal Medicine

## 2021-12-31 ENCOUNTER — Ambulatory Visit (INDEPENDENT_AMBULATORY_CARE_PROVIDER_SITE_OTHER): Payer: Managed Care, Other (non HMO) | Admitting: Internal Medicine

## 2021-12-31 ENCOUNTER — Encounter: Payer: Managed Care, Other (non HMO) | Admitting: Physical Therapy

## 2021-12-31 VITALS — BP 134/82 | HR 78 | Temp 96.9°F | Wt 165.0 lb

## 2021-12-31 DIAGNOSIS — Z0289 Encounter for other administrative examinations: Secondary | ICD-10-CM

## 2021-12-31 DIAGNOSIS — R9089 Other abnormal findings on diagnostic imaging of central nervous system: Secondary | ICD-10-CM

## 2021-12-31 DIAGNOSIS — M5126 Other intervertebral disc displacement, lumbar region: Secondary | ICD-10-CM

## 2021-12-31 DIAGNOSIS — M5020 Other cervical disc displacement, unspecified cervical region: Secondary | ICD-10-CM

## 2021-12-31 MED ORDER — PREGABALIN 75 MG PO CAPS: 75.0000 mg | ORAL_CAPSULE | Freq: Two times a day (BID) | ORAL | 0 refills | Status: DC

## 2021-12-31 MED ORDER — CYCLOBENZAPRINE HCL 10 MG PO TABS: 10.0000 mg | ORAL_TABLET | Freq: Three times a day (TID) | ORAL | 0 refills | Status: DC | PRN

## 2021-12-31 MED ORDER — CELECOXIB 50 MG PO CAPS: 50.0000 mg | ORAL_CAPSULE | Freq: Two times a day (BID) | ORAL | 0 refills | Status: DC
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
## 2022-01-06 ENCOUNTER — Encounter: Payer: Self-pay | Admitting: Internal Medicine

## 2022-01-06 NOTE — Telephone Encounter (Signed)
Okay for work note? 

## 2022-01-08 ENCOUNTER — Ambulatory Visit: Payer: Managed Care, Other (non HMO) | Admitting: Neurosurgery

## 2022-01-08 ENCOUNTER — Telehealth: Payer: Self-pay

## 2022-01-08 NOTE — Telephone Encounter (Signed)
Copied from CRM [id number]. Topic: General - Inquiry >> Jan 07, 2022 10:17 AM Jean Rios wrote: Reason for CRM: Pt called to confirm receipt of short term disability paperwork from the [facility] / pt stated this should have been faxed to the office on 9.4.23 for missing information / missing date and missing medication (attached form was missing)/ they are holding her check until they receive this information / please advise when this has been completed   Pt also mentioned she never got a letter for missing work and need this note as well asap / Pt was seen on 8.30.23 and needs a note stating she is under the providers continuous care until... / please advise

## 2022-01-08 NOTE — Telephone Encounter (Signed)
I put the copy of her FMLA on your desk and I sent the work note through My Chart.   Thanks,   -Vernona Rieger
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
## 2022-01-22 ENCOUNTER — Encounter: Payer: Self-pay | Admitting: Orthopedic Surgery

## 2022-01-22 ENCOUNTER — Ambulatory Visit (INDEPENDENT_AMBULATORY_CARE_PROVIDER_SITE_OTHER): Payer: Self-pay | Admitting: Orthopedic Surgery

## 2022-01-22 VITALS — BP 118/80 | HR 87 | Ht 65.0 in | Wt 170.0 lb

## 2022-01-22 DIAGNOSIS — M5442 Lumbago with sciatica, left side: Secondary | ICD-10-CM

## 2022-01-22 DIAGNOSIS — M542 Cervicalgia: Secondary | ICD-10-CM

## 2022-01-22 DIAGNOSIS — R2689 Other abnormalities of gait and mobility: Secondary | ICD-10-CM

## 2022-01-22 DIAGNOSIS — R531 Weakness: Secondary | ICD-10-CM

## 2022-01-22 DIAGNOSIS — M5441 Lumbago with sciatica, right side: Secondary | ICD-10-CM

## 2022-01-22 NOTE — Addendum Note (Signed)
Addended byGeronimo Boot on: 01/22/2022 03:45 PM   Modules accepted: Orders

## 2022-01-23 ENCOUNTER — Other Ambulatory Visit: Payer: Self-pay | Admitting: Internal Medicine

## 2022-01-23 NOTE — Telephone Encounter (Signed)
Requested Prescriptions  Pending Prescriptions Disp Refills  . sertraline (ZOLOFT) 100 MG tablet [Pharmacy Med Name: SERTRALINE HCL 100 MG TABLET] 135 tablet 0    Sig: Take 1.5 tablets (150 mg total) by mouth daily.     Psychiatry:  Antidepressants - SSRI - sertraline Passed - 01/23/2022  9:52 AM      Passed - AST in normal range and within 360 days    AST  Date Value Ref Range Status  12/13/2021 23 15 - 41 U/L Final         Passed - ALT in normal range and within 360 days    ALT  Date Value Ref Range Status  12/13/2021 23 0 - 44 U/L Final         Passed - Completed PHQ-2 or PHQ-9 in the last 360 days      Passed - Valid encounter within last 6 months    Recent Outpatient Visits          3 weeks ago Cervical disc herniation   [facility] [place], Coralie Keens, NP   2 months ago Chronic neck pain   [facility] [place], Coralie Keens, NP   4 months ago Multiple joint pain   [facility] [place], Coralie Keens, NP   6 months ago Encounter for general adult medical examination with abnormal findings   [facility] [place], Coralie Keens, NP   7 months ago Other fatigue   [facility] [place], Coralie Keens, NP      Future Appointments            In 2 months Baity, Coralie Keens, NP [facility]

## 2022-02-04 ENCOUNTER — Ambulatory Visit: Payer: 59 | Admitting: Physical Therapy

## 2022-02-11 ENCOUNTER — Ambulatory Visit: Payer: 59 | Attending: Internal Medicine | Admitting: Physical Therapy

## 2022-02-11 ENCOUNTER — Ambulatory Visit: Payer: 59 | Admitting: Physical Therapy

## 2022-02-11 ENCOUNTER — Encounter: Payer: Self-pay | Admitting: Physical Therapy

## 2022-02-11 DIAGNOSIS — M5412 Radiculopathy, cervical region: Secondary | ICD-10-CM | POA: Insufficient documentation

## 2022-02-11 DIAGNOSIS — M542 Cervicalgia: Secondary | ICD-10-CM | POA: Diagnosis present

## 2022-02-11 DIAGNOSIS — R262 Difficulty in walking, not elsewhere classified: Secondary | ICD-10-CM | POA: Insufficient documentation

## 2022-02-11 DIAGNOSIS — M5126 Other intervertebral disc displacement, lumbar region: Secondary | ICD-10-CM | POA: Diagnosis not present

## 2022-02-11 DIAGNOSIS — M5020 Other cervical disc displacement, unspecified cervical region: Secondary | ICD-10-CM | POA: Diagnosis not present
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
## 2022-02-13 ENCOUNTER — Ambulatory Visit: Payer: 59 | Admitting: Physical Therapy

## 2022-02-13 ENCOUNTER — Encounter: Payer: Self-pay | Admitting: Physical Therapy

## 2022-02-13 DIAGNOSIS — R262 Difficulty in walking, not elsewhere classified: Secondary | ICD-10-CM

## 2022-02-13 DIAGNOSIS — M542 Cervicalgia: Secondary | ICD-10-CM
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
## 2022-02-17 ENCOUNTER — Ambulatory Visit: Payer: 59 | Admitting: Physical Therapy

## 2022-02-17 ENCOUNTER — Encounter: Payer: Self-pay | Admitting: Physical Therapy

## 2022-02-17 DIAGNOSIS — R262 Difficulty in walking, not elsewhere classified: Secondary | ICD-10-CM

## 2022-02-17 DIAGNOSIS — M542 Cervicalgia: Secondary | ICD-10-CM
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
## 2022-02-20 ENCOUNTER — Encounter: Payer: Self-pay | Admitting: Physical Therapy

## 2022-02-20 ENCOUNTER — Ambulatory Visit: Payer: 59 | Admitting: Physical Therapy

## 2022-02-20 DIAGNOSIS — R262 Difficulty in walking, not elsewhere classified: Secondary | ICD-10-CM

## 2022-02-20 DIAGNOSIS — M542 Cervicalgia: Secondary | ICD-10-CM
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
## 2022-02-23 ENCOUNTER — Encounter (INDEPENDENT_AMBULATORY_CARE_PROVIDER_SITE_OTHER): Payer: Self-pay

## 2022-02-23 ENCOUNTER — Ambulatory Visit (HOSPITAL_COMMUNITY)
Admission: RE | Admit: 2022-02-23 | Discharge: 2022-02-23 | Disposition: A | Discharge status: Home / Self Care | Payer: 59 | Source: Ambulatory Visit | Attending: Orthopedic Surgery | Admitting: Orthopedic Surgery

## 2022-02-23 DIAGNOSIS — R2689 Other abnormalities of gait and mobility: Secondary | ICD-10-CM | POA: Insufficient documentation

## 2022-02-23 DIAGNOSIS — Y939 Activity, unspecified: Secondary | ICD-10-CM | POA: Diagnosis not present

## 2022-02-23 DIAGNOSIS — M542 Cervicalgia: Secondary | ICD-10-CM | POA: Diagnosis not present

## 2022-02-23 DIAGNOSIS — M5441 Lumbago with sciatica, right side: Secondary | ICD-10-CM | POA: Diagnosis not present

## 2022-02-23 DIAGNOSIS — Y929 Unspecified place or not applicable: Secondary | ICD-10-CM | POA: Insufficient documentation

## 2022-02-23 DIAGNOSIS — M5442 Lumbago with sciatica, left side: Secondary | ICD-10-CM | POA: Insufficient documentation

## 2022-02-23 DIAGNOSIS — R531 Weakness: Secondary | ICD-10-CM | POA: Insufficient documentation

## 2022-02-23 MED ORDER — IOHEXOL 350 MG/ML SOLN
100.0000 mL | Freq: Once | INTRAVENOUS | Status: AC | PRN
  Administered 2022-02-23: 75 mL via INTRAVENOUS

## 2022-02-24 ENCOUNTER — Ambulatory Visit: Payer: 59 | Admitting: Physical Therapy

## 2022-02-24 ENCOUNTER — Encounter: Payer: Self-pay | Admitting: Physical Therapy

## 2022-02-24 DIAGNOSIS — M542 Cervicalgia: Secondary | ICD-10-CM

## 2022-02-24 DIAGNOSIS — R262 Difficulty in walking, not elsewhere classified: Secondary | ICD-10-CM | POA: Diagnosis not present

## 2022-02-24 DIAGNOSIS — M5412 Radiculopathy, cervical region: Secondary | ICD-10-CM
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
## 2022-02-26 ENCOUNTER — Encounter: Payer: Self-pay | Admitting: Orthopedic Surgery

## 2022-02-26 NOTE — Progress Notes (Signed)
CTA of head and neck dated 02/23/22:  FINDINGS: CT HEAD FINDINGS   Brain: No evidence of acute infarction, hemorrhage, hydrocephalus, extra-axial collection or mass lesion/mass effect.   Vascular: No hyperdense vessel or unexpected calcification.   Skull: Normal. Negative for fracture or focal lesion.   Sinuses/Orbits: No acute finding.   Review of the MIP images confirms the above findings   CTA NECK FINDINGS   Aortic arch: Minimally covered.  Three vessel branching.   Right carotid system: Vessels are smoothly contoured and widely patent with no atheromatous changes.   Left carotid system: Vessels are smoothly contoured and widely patent with no atheromatous changes.   Vertebral arteries: No proximal subclavian stenosis. Vertebral arteries are smoothly contoured and widely patent to the dura.   Skeleton: Negative   Other neck: Negative   Upper chest: Clear apical lungs   Review of the MIP images confirms the above findings   CTA HEAD FINDINGS   Anterior circulation: No significant stenosis, proximal occlusion, aneurysm, or vascular malformation.   Posterior circulation: No significant stenosis, proximal occlusion, aneurysm, or vascular malformation.   Venous sinuses: Diffusely patent   Anatomic variants: None significant   Review of the MIP images confirms the above findings   IMPRESSION: Negative CTA of the head and neck.     Electronically Signed   By: Jorje Guild M.D.   On: 02/25/2022 08:08  I have personally reviewed the images and agree with the above interpretation.   Above CTA reviewed with Dr. Izora Ribas as well. No concerning findings noted.   Recommend she get cervical flexion/extension xrays and the will schedule her for phone visit to review findings. Will likely get repeat cervical MRI to check area of possible myelomalacia seen at C2 (no compression).

## 2022-02-27 ENCOUNTER — Encounter: Payer: Self-pay | Admitting: Physical Therapy

## 2022-02-27 ENCOUNTER — Ambulatory Visit: Payer: 59 | Admitting: Physical Therapy

## 2022-02-27 DIAGNOSIS — M5412 Radiculopathy, cervical region: Secondary | ICD-10-CM

## 2022-02-27 DIAGNOSIS — M542 Cervicalgia: Secondary | ICD-10-CM

## 2022-02-27 DIAGNOSIS — R262 Difficulty in walking, not elsewhere classified: Secondary | ICD-10-CM
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
## 2022-02-28 ENCOUNTER — Encounter: Payer: Self-pay | Admitting: Internal Medicine

## 2022-03-02 ENCOUNTER — Ambulatory Visit: Payer: 59 | Admitting: Physical Therapy

## 2022-03-03 ENCOUNTER — Other Ambulatory Visit: Payer: Self-pay | Admitting: Internal Medicine

## 2022-03-03 NOTE — Telephone Encounter (Signed)
Requested medication (s) are due for refill today - yes  Requested medication (s) are on the active medication list -yes  Future visit scheduled -yes  Last refill: 12/31/21 #90  Notes to clinic: non delegated Rx  Requested Prescriptions  Pending Prescriptions Disp Refills   cyclobenzaprine (FLEXERIL) 10 MG tablet [Pharmacy Med Name: CYCLOBENZAPRINE 10 MG TABLET] 90 tablet 0    Sig: TAKE 1 TABLET BY MOUTH THREE TIMES A DAY AS NEEDED FOR MUSCLE SPASMS     Not Delegated - Analgesics:  Muscle Relaxants Failed - 03/03/2022 12:39 PM      Failed - This refill cannot be delegated      Passed - Valid encounter within last 6 months    Recent Outpatient Visits           2 months ago Cervical disc herniation   [facility] [place], Coralie Keens, NP   4 months ago Chronic neck pain   [facility] [place], Coralie Keens, NP   5 months ago Multiple joint pain   [facility] [place], Coralie Keens, NP   7 months ago Encounter for general adult medical examination with abnormal findings   [facility], Coralie Keens, NP   8 months ago Other fatigue   [facility] [place], Coralie Keens, NP       Future Appointments             In 3 weeks Garnette Gunner, Coralie Keens, NP [facility] [place] Prescriptions  Pending Prescriptions Disp Refills   cyclobenzaprine (FLEXERIL) 10 MG tablet [Pharmacy Med Name: CYCLOBENZAPRINE 10 MG TABLET] 90 tablet 0    Sig: TAKE 1 TABLET BY MOUTH THREE TIMES A DAY AS NEEDED FOR MUSCLE SPASMS     Not Delegated - Analgesics:  Muscle Relaxants Failed - 03/03/2022 12:39 PM      Failed - This refill cannot be delegated      Passed - Valid encounter within last 6 months    Recent Outpatient Visits           2 months ago Cervical disc herniation   [facility] [place], Coralie Keens, NP   4 months ago Chronic neck pain   [facility] [place], Coralie Keens, NP   5  months ago Multiple joint pain   [facility] [place], Coralie Keens, NP   7 months ago Encounter for general adult medical examination with abnormal findings   [facility] [place], Coralie Keens, NP   8 months ago Other fatigue   [facility] [place], Coralie Keens, NP       Future Appointments             In 3 weeks Garnette Gunner, Coralie Keens, NP [facility]

## 2022-03-04 ENCOUNTER — Ambulatory Visit: Payer: Managed Care, Other (non HMO) | Admitting: Internal Medicine

## 2022-03-05 ENCOUNTER — Ambulatory Visit
Admission: RE | Admit: 2022-03-05 | Discharge: 2022-03-05 | Disposition: A | Discharge status: Home / Self Care | Payer: 59 | Source: Ambulatory Visit | Attending: Orthopedic Surgery | Admitting: Orthopedic Surgery

## 2022-03-05 ENCOUNTER — Encounter: Payer: Self-pay | Admitting: Physical Therapy

## 2022-03-05 ENCOUNTER — Ambulatory Visit: Payer: 59 | Attending: Internal Medicine | Admitting: Physical Therapy

## 2022-03-05 ENCOUNTER — Ambulatory Visit
Admission: RE | Admit: 2022-03-05 | Discharge: 2022-03-05 | Disposition: A | Discharge status: Home / Self Care | Payer: 59 | Source: Home / Self Care | Attending: Orthopedic Surgery | Admitting: Orthopedic Surgery

## 2022-03-05 DIAGNOSIS — M542 Cervicalgia: Secondary | ICD-10-CM | POA: Insufficient documentation

## 2022-03-05 DIAGNOSIS — M5412 Radiculopathy, cervical region: Secondary | ICD-10-CM | POA: Insufficient documentation

## 2022-03-05 DIAGNOSIS — M47812 Spondylosis without myelopathy or radiculopathy, cervical region: Secondary | ICD-10-CM | POA: Insufficient documentation

## 2022-03-05 DIAGNOSIS — R2689 Other abnormalities of gait and mobility: Secondary | ICD-10-CM

## 2022-03-05 DIAGNOSIS — R531 Weakness: Secondary | ICD-10-CM

## 2022-03-05 DIAGNOSIS — R262 Difficulty in walking, not elsewhere classified: Secondary | ICD-10-CM | POA: Diagnosis present
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
## 2022-03-09 ENCOUNTER — Encounter: Payer: Self-pay | Admitting: Physical Therapy

## 2022-03-09 ENCOUNTER — Ambulatory Visit: Payer: 59 | Admitting: Physical Therapy

## 2022-03-09 DIAGNOSIS — R262 Difficulty in walking, not elsewhere classified: Secondary | ICD-10-CM

## 2022-03-09 DIAGNOSIS — M542 Cervicalgia: Secondary | ICD-10-CM

## 2022-03-09 DIAGNOSIS — M5412 Radiculopathy, cervical region: Secondary | ICD-10-CM
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
## 2022-03-12 ENCOUNTER — Encounter: Payer: Self-pay | Admitting: Orthopedic Surgery

## 2022-03-12 ENCOUNTER — Ambulatory Visit (INDEPENDENT_AMBULATORY_CARE_PROVIDER_SITE_OTHER): Payer: 59 | Admitting: Orthopedic Surgery

## 2022-03-12 ENCOUNTER — Ambulatory Visit: Payer: 59 | Admitting: Physical Therapy

## 2022-03-12 DIAGNOSIS — M542 Cervicalgia: Secondary | ICD-10-CM | POA: Diagnosis not present

## 2022-03-12 DIAGNOSIS — R531 Weakness: Secondary | ICD-10-CM | POA: Diagnosis not present

## 2022-03-12 DIAGNOSIS — M5441 Lumbago with sciatica, right side: Secondary | ICD-10-CM | POA: Diagnosis not present

## 2022-03-12 DIAGNOSIS — M5442 Lumbago with sciatica, left side: Secondary | ICD-10-CM | POA: Diagnosis not present

## 2022-03-16 ENCOUNTER — Ambulatory Visit: Payer: 59 | Admitting: Physical Therapy

## 2022-03-16 ENCOUNTER — Encounter: Payer: Self-pay | Admitting: Physical Therapy

## 2022-03-16 ENCOUNTER — Telehealth: Payer: Self-pay | Admitting: Gastroenterology

## 2022-03-16 DIAGNOSIS — R262 Difficulty in walking, not elsewhere classified: Secondary | ICD-10-CM

## 2022-03-16 DIAGNOSIS — M542 Cervicalgia: Secondary | ICD-10-CM

## 2022-03-16 NOTE — Telephone Encounter (Signed)
Pt medical record for [facility] 06/23/2021 were sent to [facility] [address] [place] [address] [phone]
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
## 2022-03-19 ENCOUNTER — Ambulatory Visit: Payer: 59 | Admitting: Physical Therapy

## 2022-03-19 ENCOUNTER — Encounter: Payer: Self-pay | Admitting: Physical Therapy

## 2022-03-19 ENCOUNTER — Other Ambulatory Visit: Payer: Self-pay | Admitting: Internal Medicine

## 2022-03-19 DIAGNOSIS — M5412 Radiculopathy, cervical region: Secondary | ICD-10-CM

## 2022-03-19 DIAGNOSIS — R262 Difficulty in walking, not elsewhere classified: Secondary | ICD-10-CM | POA: Diagnosis not present

## 2022-03-19 DIAGNOSIS — M542 Cervicalgia: Secondary | ICD-10-CM
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
## 2022-03-19 NOTE — Telephone Encounter (Signed)
Requested medication (s) are due for refill today: No due 04/03/22  Requested medication (s) are on the active medication list: yes    Last refill: 03/04/22  #90 0 refills  Future visit scheduled Yes  03/30/22  Notes to clinic:Not delegated, please review. Thank you.  Requested Prescriptions  Pending Prescriptions Disp Refills   cyclobenzaprine (FLEXERIL) 10 MG tablet [Pharmacy Med Name: CYCLOBENZAPRINE 10 MG TABLET] 90 tablet 0    Sig: TAKE 1 TABLET BY MOUTH THREE TIMES A DAY AS NEEDED FOR MUSCLE SPASM     Not Delegated - Analgesics:  Muscle Relaxants Failed - 03/19/2022  4:00 PM      Failed - This refill cannot be delegated      Passed - Valid encounter within last 6 months    Recent Outpatient Visits           2 months ago Cervical disc herniation   [facility] [place], Salvadore Oxford, NP   4 months ago Chronic neck pain   [facility] [place], Salvadore Oxford, NP   6 months ago Multiple joint pain   [facility] [place], Salvadore Oxford, NP   8 months ago Encounter for general adult medical examination with abnormal findings   [facility] [place], Salvadore Oxford, NP   9 months ago Other fatigue   [facility] [place], Salvadore Oxford, NP       Future Appointments             In 1 week Sampson Si, Salvadore Oxford, NP [facility], [facility]

## 2022-03-24 ENCOUNTER — Ambulatory Visit
Admission: RE | Admit: 2022-03-24 | Discharge: 2022-03-24 | Disposition: A | Discharge status: Home / Self Care | Payer: 59 | Source: Ambulatory Visit | Attending: Orthopedic Surgery | Admitting: Orthopedic Surgery

## 2022-03-24 DIAGNOSIS — M4802 Spinal stenosis, cervical region: Secondary | ICD-10-CM | POA: Insufficient documentation

## 2022-03-24 DIAGNOSIS — M25511 Pain in right shoulder: Secondary | ICD-10-CM | POA: Diagnosis not present

## 2022-03-24 DIAGNOSIS — M25512 Pain in left shoulder: Secondary | ICD-10-CM | POA: Diagnosis not present

## 2022-03-24 DIAGNOSIS — M542 Cervicalgia: Secondary | ICD-10-CM | POA: Insufficient documentation

## 2022-03-24 DIAGNOSIS — M5442 Lumbago with sciatica, left side: Secondary | ICD-10-CM | POA: Insufficient documentation

## 2022-03-24 DIAGNOSIS — G9589 Other specified diseases of spinal cord: Secondary | ICD-10-CM | POA: Insufficient documentation

## 2022-03-24 DIAGNOSIS — M5441 Lumbago with sciatica, right side: Secondary | ICD-10-CM | POA: Diagnosis not present

## 2022-03-24 DIAGNOSIS — M47812 Spondylosis without myelopathy or radiculopathy, cervical region: Secondary | ICD-10-CM | POA: Diagnosis not present

## 2022-03-24 DIAGNOSIS — R531 Weakness: Secondary | ICD-10-CM | POA: Diagnosis present

## 2022-03-25 ENCOUNTER — Encounter: Payer: Self-pay | Admitting: Orthopedic Surgery

## 2022-03-25 NOTE — Progress Notes (Signed)
MRI of cervical spine dated 03/24/22:  FINDINGS: Alignment: Straightening of the normal cervical lordosis.   Vertebrae: No fracture, evidence of discitis, or bone lesion.   Cord: The region of previously seen cord signal abnormality at the C2 level is incompletely imaged. This area appears unchanged from prior exam and most likely represents an area of chronic myelomalacia, possibly related to prior trauma given patient's history.   Posterior Fossa, vertebral arteries, paraspinal tissues: Negative.   Disc levels:   C1-C2: No significant degenerative change.   C2-C3: Mild bilateral facet degenerative change. No significant disc bulge. No spinal canal stenosis. No neural foraminal stenosis.   C3-C4: Mild bilateral facet degenerative change. No significant disc bulge. No spinal canal stenosis. Uncovertebral hypertrophy. Mild right neural foraminal stenosis.   C4-C5: Mild bilateral facet degenerative change. No significant disc bulge. No spinal canal stenosis. Uncovertebral hypertrophy. Mild right neural foraminal stenosis.   C5-C6: Moderate bilateral facet degenerative change. Circumferential disc bulge. Mild to moderate spinal canal narrowing. Bilateral uncovertebral hypertrophy. Moderate to severe right and moderate left neural foraminal stenosis.   C6-C7: Minimal disc bulge. No spinal canal narrowing. Mild bilateral facet degenerative change. No neural foraminal stenosis.   C7-T1: Minimal disc bulge. No spinal canal narrowing. Mild bilateral facet degenerative change. No neural foraminal stenosis.   IMPRESSION: 1. Multilevel degenerative changes of the cervical spine, worst at C5-C6 with mild to moderate spinal canal narrowing, moderate to severe right and moderate left neural foraminal stenosis at this level. These findings are unchanged comapred to 12/13/21. 2. The region of cord signal abnormality at the C2 level is incompletely imaged. This area appears unchanged from  prior exam and most likely represents an area of chronic myelomalacia, possibly related to prior trauma given patient's history.     Electronically Signed   By: Lorenza Cambridge M.D.   On: 03/24/2022 17:02  I have personally reviewed the images and agree with the above interpretation.  MRI reviewed with Dr. Myer Haff. Area at C2 likely dilated central canal that is chronic finding.   Will plan to see her back in clinic to review this and bilateral EMG results (another provider ordered).

## 2022-03-30 ENCOUNTER — Encounter: Payer: Self-pay | Admitting: Internal Medicine

## 2022-03-30 ENCOUNTER — Ambulatory Visit (INDEPENDENT_AMBULATORY_CARE_PROVIDER_SITE_OTHER): Payer: 59 | Admitting: Internal Medicine

## 2022-03-30 VITALS — BP 124/82 | HR 88 | Temp 96.6°F | Wt 175.0 lb

## 2022-03-30 DIAGNOSIS — F5104 Psychophysiologic insomnia: Secondary | ICD-10-CM

## 2022-03-30 DIAGNOSIS — Z8673 Personal history of transient ischemic attack (TIA), and cerebral infarction without residual deficits: Secondary | ICD-10-CM

## 2022-03-30 DIAGNOSIS — Z6829 Body mass index (BMI) 29.0-29.9, adult: Secondary | ICD-10-CM

## 2022-03-30 DIAGNOSIS — M5441 Lumbago with sciatica, right side: Secondary | ICD-10-CM

## 2022-03-30 DIAGNOSIS — G8929 Other chronic pain: Secondary | ICD-10-CM

## 2022-03-30 DIAGNOSIS — M5442 Lumbago with sciatica, left side: Secondary | ICD-10-CM

## 2022-03-30 DIAGNOSIS — M797 Fibromyalgia: Secondary | ICD-10-CM

## 2022-03-30 DIAGNOSIS — E663 Overweight: Secondary | ICD-10-CM

## 2022-03-30 DIAGNOSIS — R519 Headache, unspecified: Secondary | ICD-10-CM

## 2022-03-30 DIAGNOSIS — E7800 Pure hypercholesterolemia, unspecified: Secondary | ICD-10-CM

## 2022-03-30 DIAGNOSIS — D509 Iron deficiency anemia, unspecified: Secondary | ICD-10-CM

## 2022-03-30 DIAGNOSIS — G609 Hereditary and idiopathic neuropathy, unspecified: Secondary | ICD-10-CM

## 2022-03-30 DIAGNOSIS — G90A Postural orthostatic tachycardia syndrome (POTS): Secondary | ICD-10-CM

## 2022-03-30 DIAGNOSIS — K219 Gastro-esophageal reflux disease without esophagitis: Secondary | ICD-10-CM

## 2022-03-30 DIAGNOSIS — Z23 Encounter for immunization: Secondary | ICD-10-CM | POA: Diagnosis not present

## 2022-03-30 DIAGNOSIS — F32A Depression, unspecified: Secondary | ICD-10-CM

## 2022-03-30 DIAGNOSIS — F419 Anxiety disorder, unspecified: Secondary | ICD-10-CM

## 2022-03-30 MED ORDER — ASPIRIN 81 MG PO TBEC: 81.0000 mg | DELAYED_RELEASE_TABLET | Freq: Every day | ORAL | 12 refills | Status: AC

## 2022-03-30 MED ORDER — ATORVASTATIN CALCIUM 20 MG PO TABS: 20.0000 mg | ORAL_TABLET | Freq: Every day | ORAL | 1 refills | Status: DC

## 2022-03-30 MED ORDER — BUPROPION HCL ER (XL) 150 MG PO TB24: 150.0000 mg | ORAL_TABLET | Freq: Every day | ORAL | 1 refills | Status: DC

## 2022-03-30 NOTE — Assessment & Plan Note (Signed)
Encouraged regular stretching and exercises to help tone the muscles

## 2022-03-30 NOTE — Assessment & Plan Note (Signed)
Continue pregabalin She will continue to follow with neurology

## 2022-03-30 NOTE — Assessment & Plan Note (Signed)
Lipid profile today Encouraged her to consume a low fat diet Continue atorvastatin, refilled today Will have her start baby aspirin

## 2022-03-30 NOTE — Assessment & Plan Note (Signed)
Encouraged diet and exercise for weight loss ?

## 2022-03-30 NOTE — Assessment & Plan Note (Signed)
Encouraged adequate water intake Make position changes slowly

## 2022-03-30 NOTE — Assessment & Plan Note (Signed)
Continue Omeprazole ?

## 2022-03-30 NOTE — Assessment & Plan Note (Signed)
Currently not medicated Will monitor 

## 2022-03-30 NOTE — Patient Instructions (Signed)

## 2022-03-30 NOTE — Assessment & Plan Note (Signed)
CMET and lipid profile today Encouraged her to consume a low fat diet Continue atorvastatin, refilled today
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
## 2022-03-30 NOTE — Assessment & Plan Note (Signed)
Deteriorated, likely situational Continue sertraline Will add in wellbutrin 150 mg XL daily Support offered

## 2022-03-30 NOTE — Assessment & Plan Note (Signed)
Cbc today

## 2022-03-30 NOTE — Assessment & Plan Note (Signed)
Currently managed on Zonegran and ibuprofen She is waiting to see if emgality gets approved She will continue to follow with neurology

## 2022-03-30 NOTE — Assessment & Plan Note (Signed)
Continue celebrex, cyclobenzaprin and pregabalin Encouraged daily stretching and core strengthening

## 2022-03-31 ENCOUNTER — Encounter: Payer: Self-pay | Admitting: Internal Medicine

## 2022-03-31 DIAGNOSIS — E782 Mixed hyperlipidemia: Secondary | ICD-10-CM

## 2022-03-31 LAB — LIPID PANEL
Cholesterol: 345 mg/dL — ABNORMAL HIGH (ref <200)
HDL: 54 mg/dL (ref >=50)
LDL Cholesterol (Calc): 256 mg/dL (calc) — ABNORMAL HIGH
Non-HDL Cholesterol (Calc): 291 mg/dL (calc) — ABNORMAL HIGH (ref <130)
Total CHOL/HDL Ratio: 6.4 (calc) — ABNORMAL HIGH (ref <5.0)
Triglycerides: 180 mg/dL — ABNORMAL HIGH (ref <150)

## 2022-03-31 LAB — COMPLETE METABOLIC PANEL WITH GFR
AG Ratio: 1.8 (calc) (ref 1.0–2.5)
ALT: 31 U/L — ABNORMAL HIGH (ref 6–29)
AST: 22 U/L (ref 10–35)
Albumin: 4.6 g/dL (ref 3.6–5.1)
Alkaline phosphatase (APISO): 108 U/L (ref 31–125)
BUN: 13 mg/dL (ref 7–25)
CO2: 26 mmol/L (ref 20–32)
Calcium: 9.5 mg/dL (ref 8.6–10.2)
Chloride: 107 mmol/L (ref 98–110)
Creat: 0.99 mg/dL (ref 0.50–0.99)
Globulin: 2.6 g/dL (calc) (ref 1.9–3.7)
Glucose, Bld: 90 mg/dL (ref 65–99)
Potassium: 4.5 mmol/L (ref 3.5–5.3)
Sodium: 140 mmol/L (ref 135–146)
Total Bilirubin: 0.4 mg/dL (ref 0.2–1.2)
Total Protein: 7.2 g/dL (ref 6.1–8.1)
eGFR: 72 mL/min/{1.73_m2} (ref >=60)

## 2022-03-31 LAB — CBC
HCT: 38.4 % (ref 35.0–45.0)
Hemoglobin: 13.1 g/dL (ref 11.7–15.5)
MCH: 29.8 pg (ref 27.0–33.0)
MCHC: 34.1 g/dL (ref 32.0–36.0)
MCV: 87.3 fL (ref 80.0–100.0)
MPV: 9.4 fL (ref 7.5–12.5)
Platelets: 287 10*3/uL (ref 140–400)
RBC: 4.4 10*6/uL (ref 3.80–5.10)
RDW: 12.5 % (ref 11.0–15.0)
WBC: 4.7 10*3/uL (ref 3.8–10.8)

## 2022-03-31 MED ORDER — ATORVASTATIN CALCIUM 40 MG PO TABS: 40.0000 mg | ORAL_TABLET | Freq: Every day | ORAL | 1 refills | Status: DC

## 2022-04-06 ENCOUNTER — Encounter: Payer: Self-pay | Admitting: Internal Medicine
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
## 2022-04-07 ENCOUNTER — Encounter: Payer: Self-pay | Admitting: Orthopedic Surgery

## 2022-04-07 ENCOUNTER — Ambulatory Visit (INDEPENDENT_AMBULATORY_CARE_PROVIDER_SITE_OTHER): Payer: 59 | Admitting: Orthopedic Surgery

## 2022-04-07 DIAGNOSIS — M5441 Lumbago with sciatica, right side: Secondary | ICD-10-CM | POA: Diagnosis not present

## 2022-04-07 DIAGNOSIS — M5442 Lumbago with sciatica, left side: Secondary | ICD-10-CM

## 2022-04-07 DIAGNOSIS — M542 Cervicalgia: Secondary | ICD-10-CM | POA: Diagnosis not present

## 2022-04-07 DIAGNOSIS — R202 Paresthesia of skin: Secondary | ICD-10-CM

## 2022-04-07 DIAGNOSIS — R200 Anesthesia of skin: Secondary | ICD-10-CM | POA: Diagnosis not present

## 2022-04-07 NOTE — Patient Instructions (Signed)
It was so nice to see you today, I am sorry that you are still hurting so much.   I put in a referral for you to see physical medicine and rehab at the [facility]. They should call you to schedule or you can call them at [phone].   Continue to follow up with neurology.   I gave you a work note. Let me know if you need anything else.   I will see you back in 6-7 weeks. Please do not hesitate to call if you have any questions or concerns. You can also message me in MyChart.   Drake Leach PA-C [phone]

## 2022-04-20 ENCOUNTER — Other Ambulatory Visit: Payer: Self-pay | Admitting: Internal Medicine

## 2022-04-21 ENCOUNTER — Other Ambulatory Visit: Payer: Self-pay | Admitting: Internal Medicine

## 2022-04-21 NOTE — Telephone Encounter (Signed)
Requested Prescriptions  Pending Prescriptions Disp Refills   celecoxib (CELEBREX) 50 MG capsule [Pharmacy Med Name: CELECOXIB 50 MG CAPSULE] 180 capsule 0    Sig: TAKE 1 CAPSULE BY MOUTH 2 TIMES DAILY.     Analgesics:  COX2 Inhibitors Failed - 04/20/2022 12:42 AM      Failed - Manual Review: Labs are only required if the patient has taken medication for more than 8 weeks.      Failed - ALT in normal range and within 360 days    ALT  Date Value Ref Range Status  03/30/2022 31 (H) 6 - 29 U/L Final         Passed - HGB in normal range and within 360 days    Hemoglobin  Date Value Ref Range Status  03/30/2022 13.1 11.7 - 15.5 g/dL Final         Passed - Cr in normal range and within 360 days    Creat  Date Value Ref Range Status  03/30/2022 0.99 0.50 - 0.99 mg/dL Final         Passed - HCT in normal range and within 360 days    HCT  Date Value Ref Range Status  03/30/2022 38.4 35.0 - 45.0 % Final         Passed - AST in normal range and within 360 days    AST  Date Value Ref Range Status  03/30/2022 22 10 - 35 U/L Final         Passed - eGFR is 30 or above and within 360 days    GFR, Estimated  Date Value Ref Range Status  12/14/2021 >60 >60 mL/min Final    Comment:    (NOTE) Calculated using the CKD-EPI Creatinine Equation (2021)    eGFR  Date Value Ref Range Status  03/30/2022 72 > OR = 60 mL/min/1.23m Final         Passed - Patient is not pregnant      Passed - Valid encounter within last 12 months    Recent Outpatient Visits           3 weeks ago Pure hypercholesterolemia   [facility]   3 months ago Cervical disc herniation   [facility]   5 months [facility]   7 months ago Multiple joint pain   [facility]   9 months ago Encounter for general adult medical examination with  abnormal findings   [facility]       Future Appointments             In 1 month Baity[facility]

## 2022-04-30 ENCOUNTER — Other Ambulatory Visit: Payer: Self-pay | Admitting: Internal Medicine

## 2022-04-30 ENCOUNTER — Encounter: Payer: Self-pay | Admitting: Internal Medicine

## 2022-05-01 MED ORDER — PREGABALIN 75 MG PO CAPS: 75.0000 mg | ORAL_CAPSULE | Freq: Two times a day (BID) | ORAL | 0 refills | Status: DC

## 2022-05-05 ENCOUNTER — Other Ambulatory Visit: Payer: Self-pay | Admitting: Internal Medicine

## 2022-05-06 NOTE — Telephone Encounter (Signed)
Requested medication (s) are due for refill today - yes  Requested medication (s) are on the active medication list -yes  Future visit scheduled -yes  Last refill: 03/04/22 #90  Notes to clinic: non delegated Rx  Requested Prescriptions  Pending Prescriptions Disp Refills   cyclobenzaprine (FLEXERIL) 10 MG tablet [Pharmacy Med Name: CYCLOBENZAPRINE 10 MG TABLET] 90 tablet 0    Sig: TAKE 1 TABLET BY MOUTH THREE TIMES A DAY AS NEEDED FOR MUSCLE SPASM     Not Delegated - Analgesics:  Muscle Relaxants Failed - 05/05/2022  4:17 PM      Failed - This refill cannot be delegated      Passed - Valid encounter within last 6 months    Recent Outpatient Visits           1 month ago Pure hypercholesterolemia   [facility] [place], Coralie Keens, NP   4 months ago Cervical disc herniation   [facility] [place], Coralie Keens, NP   6 months ago Chronic neck pain   [facility] [place], Coralie Keens, NP   8 months ago Multiple joint pain   [facility] [place], Coralie Keens, NP   10 months ago Encounter for general adult medical examination with abnormal findings   [facility], Coralie Keens, NP       Future Appointments             In 3 weeks Garnette Gunner, Coralie Keens, NP [facility], [facility]               Requested Prescriptions  Pending Prescriptions Disp Refills   cyclobenzaprine (FLEXERIL) 10 MG tablet [Pharmacy Med Name: CYCLOBENZAPRINE 10 MG TABLET] 90 tablet 0    Sig: TAKE 1 TABLET BY MOUTH THREE TIMES A DAY AS NEEDED FOR MUSCLE SPASM     Not Delegated - Analgesics:  Muscle Relaxants Failed - 05/05/2022  4:17 PM      Failed - This refill cannot be delegated      Passed - Valid encounter within last 6 months    Recent Outpatient Visits           1 month ago Pure hypercholesterolemia   [place], Coralie Keens, NP   4 months ago Cervical disc herniation   [facility] [place],  Coralie Keens, NP   6 months ago Chronic neck pain   [facility] [place], Coralie Keens, NP   8 months ago Multiple joint pain   [facility] [place], Coralie Keens, NP   10 months ago Encounter for general adult medical examination with abnormal findings   [facility] [place], Coralie Keens, NP       Future Appointments             In 3 weeks Garnette Gunner, Coralie Keens, NP [facility], [facility]

## 2022-05-18 ENCOUNTER — Ambulatory Visit: Payer: Managed Care, Other (non HMO) | Admitting: Neurology
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
## 2022-05-20 ENCOUNTER — Encounter: Payer: Self-pay | Admitting: Internal Medicine

## 2022-05-26 ENCOUNTER — Encounter: Payer: Self-pay | Admitting: Orthopedic Surgery

## 2022-05-26 ENCOUNTER — Ambulatory Visit (INDEPENDENT_AMBULATORY_CARE_PROVIDER_SITE_OTHER): Payer: 59 | Admitting: Orthopedic Surgery

## 2022-05-26 DIAGNOSIS — M4722 Other spondylosis with radiculopathy, cervical region: Secondary | ICD-10-CM

## 2022-05-26 DIAGNOSIS — M47812 Spondylosis without myelopathy or radiculopathy, cervical region: Secondary | ICD-10-CM

## 2022-05-26 DIAGNOSIS — M47816 Spondylosis without myelopathy or radiculopathy, lumbar region: Secondary | ICD-10-CM

## 2022-05-26 DIAGNOSIS — M5412 Radiculopathy, cervical region: Secondary | ICD-10-CM

## 2022-05-26 NOTE — Patient Instructions (Signed)
It was so nice to see you today. Thank you so much for coming in.    I am so glad that you are feeling better.   I sent physical therapy orders to [facility] on [address] in [place]. You can call them if you don't hear from them to schedule your visit.   Let me know if any issues returning back to work.   I will see you back in 6-8 weeks. Please do not hesitate to call if you have any questions or concerns. You can also message me in [facility].   Please bring baby pictures when you come back, I'm so excited for you!!!  Geronimo Boot PA-C [phone]

## 2022-06-01 ENCOUNTER — Ambulatory Visit (INDEPENDENT_AMBULATORY_CARE_PROVIDER_SITE_OTHER): Payer: 59 | Admitting: Internal Medicine

## 2022-06-01 ENCOUNTER — Encounter: Payer: Self-pay | Admitting: Internal Medicine

## 2022-06-01 VITALS — BP 138/82 | HR 78 | Temp 96.8°F | Wt 172.0 lb

## 2022-06-01 DIAGNOSIS — R0602 Shortness of breath: Secondary | ICD-10-CM

## 2022-06-01 DIAGNOSIS — R41840 Attention and concentration deficit: Secondary | ICD-10-CM

## 2022-06-01 DIAGNOSIS — F419 Anxiety disorder, unspecified: Secondary | ICD-10-CM

## 2022-06-01 DIAGNOSIS — R748 Abnormal levels of other serum enzymes: Secondary | ICD-10-CM

## 2022-06-01 DIAGNOSIS — Z9189 Other specified personal risk factors, not elsewhere classified: Secondary | ICD-10-CM

## 2022-06-01 DIAGNOSIS — R42 Dizziness and giddiness: Secondary | ICD-10-CM | POA: Diagnosis not present

## 2022-06-01 DIAGNOSIS — R0789 Other chest pain: Secondary | ICD-10-CM | POA: Diagnosis not present

## 2022-06-01 DIAGNOSIS — E782 Mixed hyperlipidemia: Secondary | ICD-10-CM | POA: Diagnosis not present

## 2022-06-01 DIAGNOSIS — F32A Depression, unspecified: Secondary | ICD-10-CM

## 2022-06-01 NOTE — Assessment & Plan Note (Signed)
C-Met and lipid profile today Encouraged her to consume low-fat diet We will adjust atorvastatin if needed based on labs
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
## 2022-06-01 NOTE — Assessment & Plan Note (Signed)
Failed bupropion, will D/C Continue sertraline for now She declines referral for therapist at this time

## 2022-06-02 ENCOUNTER — Encounter: Payer: Self-pay | Admitting: Internal Medicine

## 2022-06-02 DIAGNOSIS — E782 Mixed hyperlipidemia: Secondary | ICD-10-CM

## 2022-06-02 LAB — COMPLETE METABOLIC PANEL WITH GFR
AG Ratio: 1.7 (calc) (ref 1.0–2.5)
ALT: 34 U/L — ABNORMAL HIGH (ref 6–29)
AST: 22 U/L (ref 10–35)
Albumin: 4.5 g/dL (ref 3.6–5.1)
Alkaline phosphatase (APISO): 95 U/L (ref 31–125)
BUN: 18 mg/dL (ref 7–25)
CO2: 21 mmol/L (ref 20–32)
Calcium: 9.7 mg/dL (ref 8.6–10.2)
Chloride: 104 mmol/L (ref 98–110)
Creat: 0.97 mg/dL (ref 0.50–0.99)
Globulin: 2.7 g/dL (calc) (ref 1.9–3.7)
Glucose, Bld: 99 mg/dL (ref 65–99)
Potassium: 4.3 mmol/L (ref 3.5–5.3)
Sodium: 141 mmol/L (ref 135–146)
Total Bilirubin: 0.5 mg/dL (ref 0.2–1.2)
Total Protein: 7.2 g/dL (ref 6.1–8.1)
eGFR: 73 mL/min/{1.73_m2} (ref >=60)

## 2022-06-02 LAB — LIPID PANEL
Cholesterol: 332 mg/dL — ABNORMAL HIGH (ref <200)
HDL: 56 mg/dL (ref >=50)
LDL Cholesterol (Calc): 250 mg/dL (calc) — ABNORMAL HIGH
Non-HDL Cholesterol (Calc): 276 mg/dL (calc) — ABNORMAL HIGH (ref <130)
Total CHOL/HDL Ratio: 5.9 (calc) — ABNORMAL HIGH (ref <5.0)
Triglycerides: 121 mg/dL (ref <150)

## 2022-06-03 MED ORDER — ROSUVASTATIN CALCIUM 20 MG PO TABS: 20.0000 mg | ORAL_TABLET | Freq: Every day | ORAL | 1 refills | Status: DC
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
## 2022-06-08 ENCOUNTER — Ambulatory Visit: Payer: 59

## 2022-06-15 ENCOUNTER — Ambulatory Visit: Payer: 59 | Attending: Orthopedic Surgery | Admitting: Physical Therapy

## 2022-06-15 NOTE — Therapy (Deleted)
OUTPATIENT PHYSICAL THERAPY CERVICAL EVALUATION   Patient Name: Jean Rios MRN: [id number] DOB:1975/09/15, 47 y.o., female Today's Date: 06/15/2022  END OF SESSION:   Past Medical History:  Diagnosis Date   Anemia    Anxiety    Chronic back pain    Depression    Fibromyalgia    Frequent headaches    Hyperlipidemia    Kidney stone    Nerve damage    POTS (postural orthostatic tachycardia syndrome)    Stroke [facility])    Past Surgical History:  Procedure Laterality Date   COLONOSCOPY WITH PROPOFOL N/A 07/10/2021   Procedure: COLONOSCOPY WITH PROPOFOL;  Surgeon: Jonathon Bellows, MD;  Location: [facility] ENDOSCOPY;  Service: Gastroenterology;  Laterality: N/A;   ESOPHAGOGASTRODUODENOSCOPY N/A 07/10/2021   Procedure: ESOPHAGOGASTRODUODENOSCOPY (EGD);  Surgeon: Jonathon Bellows, MD;  Location: [facility] ENDOSCOPY;  Service: Gastroenterology;  Laterality: N/A;   PARTIAL HYSTERECTOMY  2014   Patient Active Problem List   Diagnosis Date Noted   Mixed hyperlipidemia 06/01/2022   Iron deficiency anemia 03/30/2022   Idiopathic peripheral neuropathy 03/30/2022   GERD (gastroesophageal reflux disease) 12/13/2021   Overweight with body mass index (BMI) of 29 to 29.9 in adult 09/08/2021   Idiopathic urticaria 06/23/2021   Anxiety and depression 01/21/2021   Insomnia 01/21/2021   History of TIA (transient ischemic attack) 01/21/2021   Frequent headaches 01/21/2021   POTS (postural orthostatic tachycardia syndrome) 01/21/2021   Chronic back pain 01/21/2021   Fibromyalgia 01/21/2021    PCP: ***  REFERRING PROVIDER: ***  REFERRING DIAG: ***  THERAPY DIAG:  No diagnosis found.  Rationale for Evaluation and Treatment: {HABREHAB:27488}  ONSET DATE: ***  SUBJECTIVE:                                                                                                                                                                                                         SUBJECTIVE  STATEMENT: ***  PERTINENT HISTORY:  ***  PAIN:  Are you having pain? {OPRCPAIN:27236}  PRECAUTIONS: {Therapy precautions:24002}  WEIGHT BEARING RESTRICTIONS: {Yes ***/No:24003}  FALLS:  Has patient fallen in last 6 months? {fallsyesno:27318}  LIVING ENVIRONMENT: Lives with: {OPRC lives with:25569::"lives with their family"} Lives in: {Lives in:25570} Stairs: {opstairs:27293} Has following equipment at home: {Assistive devices:23999}  OCCUPATION: ***  PLOF: {PLOF:24004}  PATIENT GOALS: ***  NEXT MD VISIT: ***  OBJECTIVE:   DIAGNOSTIC FINDINGS:  ***  PATIENT SURVEYS:  {rehab surveys:24030}  COGNITION: Overall cognitive status: {cognition:24006}  SENSATION: {sensation:27233}  POSTURE: {posture:25561}  PALPATION: ***   CERVICAL ROM:   {AROM/PROM:27142} ROM A/PROM (deg)  eval  Flexion   Extension   Right lateral flexion   Left lateral flexion   Right rotation   Left rotation    (Blank rows = not tested)  UPPER EXTREMITY ROM:  {AROM/PROM:27142} ROM Right eval Left eval  Shoulder flexion    Shoulder extension    Shoulder abduction    Shoulder adduction    Shoulder extension    Shoulder internal rotation    Shoulder external rotation    Elbow flexion    Elbow extension    Wrist flexion    Wrist extension    Wrist ulnar deviation    Wrist radial deviation    Wrist pronation    Wrist supination     (Blank rows = not tested)  UPPER EXTREMITY MMT:  MMT Right eval Left eval  Shoulder flexion    Shoulder extension    Shoulder abduction    Shoulder adduction    Shoulder extension    Shoulder internal rotation    Shoulder external rotation    Middle trapezius    Lower trapezius    Elbow flexion    Elbow extension    Wrist flexion    Wrist extension    Wrist ulnar deviation    Wrist radial deviation    Wrist pronation    Wrist supination    Grip strength     (Blank rows = not tested)  CERVICAL SPECIAL TESTS:  {Cervical special  tests:25246}  FUNCTIONAL TESTS:  {Functional tests:24029}  TODAY'S TREATMENT:                                                                                                                              DATE: ***   PATIENT EDUCATION:  Education details: *** Person educated: {Person educated:25204} Education method: {Education Method:25205} Education comprehension: {Education Comprehension:25206}  HOME EXERCISE PROGRAM: ***  ASSESSMENT:  CLINICAL IMPRESSION: Patient is a *** y.o. *** who was seen today for physical therapy evaluation and treatment for ***.   OBJECTIVE IMPAIRMENTS: {opptimpairments:25111}.   ACTIVITY LIMITATIONS: {activitylimitations:27494}  PARTICIPATION LIMITATIONS: {participationrestrictions:25113}  PERSONAL FACTORS: {Personal factors:25162} are also affecting patient's functional outcome.   REHAB POTENTIAL: {rehabpotential:25112}  CLINICAL DECISION MAKING: {clinical decision making:25114}  EVALUATION COMPLEXITY: {Evaluation complexity:25115}   GOALS: Goals reviewed with patient? {yes/no:20286}  SHORT TERM GOALS: Target date: ***  *** Baseline: *** Goal status: {GOALSTATUS:25110}  2.  *** Baseline: *** Goal status: {GOALSTATUS:25110}  3.  *** Baseline: *** Goal status: {GOALSTATUS:25110}  4.  *** Baseline: *** Goal status: {GOALSTATUS:25110}  5.  *** Baseline: *** Goal status: {GOALSTATUS:25110}  6.  *** Baseline: *** Goal status: {GOALSTATUS:25110}  LONG TERM GOALS: Target date: ***  *** Baseline: *** Goal status: {GOALSTATUS:25110}  2.  *** Baseline: *** Goal status: {GOALSTATUS:25110}  3.  *** Baseline: *** Goal status: {GOALSTATUS:25110}  4.  *** Baseline: *** Goal status: {GOALSTATUS:25110}  5.  *** Baseline: *** Goal status: {GOALSTATUS:25110}  6.  *** Baseline: *** Goal status: {  GOALSTATUS:25110}   PLAN:  PT FREQUENCY: {rehab frequency:25116}  PT DURATION: {rehab duration:25117}  PLANNED  INTERVENTIONS: {rehab planned interventions:25118::"Therapeutic exercises","Therapeutic activity","Neuromuscular re-education","Balance training","Gait training","Patient/Family education","Self Care","Joint mobilization"}  PLAN FOR NEXT SESSION: ***   Archer Vise April Ma L Allye Hoyos, PT 06/15/2022, 7:56 AM

## 2022-07-03 HISTORY — PX: TENDON RELEASE: SHX230
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
## 2022-07-14 ENCOUNTER — Encounter: Payer: Self-pay | Admitting: Orthopedic Surgery

## 2022-07-14 ENCOUNTER — Ambulatory Visit: Payer: Self-pay | Admitting: Orthopedic Surgery

## 2022-07-14 ENCOUNTER — Ambulatory Visit (INDEPENDENT_AMBULATORY_CARE_PROVIDER_SITE_OTHER): Payer: 59 | Admitting: Orthopedic Surgery

## 2022-07-14 VITALS — BP 122/84 | Wt 173.0 lb

## 2022-07-14 DIAGNOSIS — M4722 Other spondylosis with radiculopathy, cervical region: Secondary | ICD-10-CM | POA: Diagnosis not present

## 2022-07-14 DIAGNOSIS — M5412 Radiculopathy, cervical region: Secondary | ICD-10-CM

## 2022-07-14 DIAGNOSIS — M47812 Spondylosis without myelopathy or radiculopathy, cervical region: Secondary | ICD-10-CM
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
## 2022-07-17 ENCOUNTER — Other Ambulatory Visit: Payer: Self-pay | Admitting: Internal Medicine

## 2022-07-17 NOTE — Telephone Encounter (Signed)
Requested medication (s) are due for refill today: historical medication , yes   Requested medication (s) are on the active medication list: yes   Last refill:  celebrex- historical medication , zoloft- 04/21/22 #135 0 refills  Future visit scheduled: no  Notes to clinic:  celebrex- historical medication . Do you want to order Rx? Zoloft - no refills remain. Do you want to refill Rx?     Requested Prescriptions  Pending Prescriptions Disp Refills   celecoxib (CELEBREX) 50 MG capsule [Pharmacy Med Name: CELECOXIB 50 MG CAPSULE] 180 capsule     Sig: TAKE 1 CAPSULE BY MOUTH TWICE A DAY     Analgesics:  COX2 Inhibitors Failed - 07/17/2022  1:57 AM      Failed - Manual Review: Labs are only required if the patient has taken medication for more than 8 weeks.      Failed - ALT in normal range and within 360 days    ALT  Date Value Ref Range Status  06/01/2022 34 (H) 6 - 29 U/L Final         Passed - HGB in normal range and within 360 days    Hemoglobin  Date Value Ref Range Status  03/30/2022 13.1 11.7 - 15.5 g/dL Final         Passed - Cr in normal range and within 360 days    Creat  Date Value Ref Range Status  06/01/2022 0.97 0.50 - 0.99 mg/dL Final         Passed - HCT in normal range and within 360 days    HCT  Date Value Ref Range Status  03/30/2022 38.4 35.0 - 45.0 % Final         Passed - AST in normal range and within 360 days    AST  Date Value Ref Range Status  06/01/2022 22 10 - 35 U/L Final         Passed - eGFR is 30 or above and within 360 days    GFR, Estimated  Date Value Ref Range Status  12/14/2021 >60 >60 mL/min Final    Comment:    (NOTE) Calculated using the CKD-EPI Creatinine Equation (2021)    eGFR  Date Value Ref Range Status  06/01/2022 73 > OR = 60 mL/min/1.18m2 Final         Passed - Patient is not pregnant      Passed - Valid encounter within last 12 months    Recent Outpatient Visits           1 month ago Elevated liver  enzymes   [place], Coralie Keens, NP   3 months ago Pure hypercholesterolemia   [facility] [place], [place], [place]   6 months ago Cervical disc herniation   [facility] [place], Coralie Keens, NP   8 months ago Chronic neck pain   [facility] [place], Coralie Keens, NP   10 months ago Multiple joint pain   [facility] [place], [place], NP               sertraline (ZOLOFT) 100 MG tablet [Pharmacy Med Name: SERTRALINE HCL 100 MG TABLET] 135 tablet 0    Sig: TAKE 1.5 TABLETS (150 MG TOTAL) BY MOUTH DAILY     Psychiatry:  Antidepressants - SSRI - sertraline Failed - 07/17/2022  1:57 AM  Failed - ALT in normal range and within 360 days    ALT  Date Value Ref Range Status  06/01/2022 34 (H) 6 - 29 U/L Final         Passed - AST in normal range and within 360 days    AST  Date Value Ref Range Status  06/01/2022 22 10 - 35 U/L Final         Passed - Completed PHQ-2 or PHQ-9 in the last 360 days      Passed - Valid encounter within last 6 months    Recent Outpatient Visits           1 month ago Elevated liver enzymes   [facility] [place], Coralie Keens, NP   3 months ago Pure hypercholesterolemia   [facility] [place], Coralie Keens, NP   6 months ago Cervical disc herniation   [facility] [place], Coralie Keens, NP   8 months ago Chronic neck pain   [facility] [place], Coralie Keens, NP   10 months ago Multiple joint pain   [facility] [place], Coralie Keens, [place]

## 2022-07-20 ENCOUNTER — Other Ambulatory Visit: Payer: Self-pay | Admitting: Gastroenterology

## 2022-07-21 ENCOUNTER — Other Ambulatory Visit: Payer: Self-pay | Admitting: Internal Medicine

## 2022-07-22 NOTE — Telephone Encounter (Signed)
Requested medication (s) are due for refill today: Due 07/31/22  Requested medication (s) are on the active medication list: yes  Last refill: 05/01/22  #180  0  refills  Future visit scheduled No  Notes to clinic:  Not delegated, please review. Thank you.  Requested Prescriptions  Pending Prescriptions Disp Refills   pregabalin (LYRICA) 75 MG capsule [Pharmacy Med Name: PREGABALIN 75 MG CAPSULE] 180 capsule 0    Sig: TAKE 1 CAPSULE BY MOUTH TWICE A DAY     Not Delegated - Neurology:  Anticonvulsants - Controlled - pregabalin Failed - 07/21/2022  6:39 PM      Failed - This refill cannot be delegated      Passed - Cr in normal range and within 360 days    Creat  Date Value Ref Range Status  06/01/2022 0.97 0.50 - 0.99 mg/dL Final         Passed - Completed PHQ-2 or PHQ-9 in the last 360 days      Passed - Valid encounter within last 12 months    Recent Outpatient Visits           1 month ago Elevated liver enzymes   [place], Coralie Keens, NP   3 months ago Pure hypercholesterolemia   [facility] [place], Coralie Keens, NP   6 months ago Cervical disc herniation   [facility] [place], Coralie Keens, NP   8 months ago Chronic neck pain   [facility] [place], Coralie Keens, NP   10 months ago Multiple joint pain   [facility] [place], Coralie Keens, [place]

## 2022-07-28 ENCOUNTER — Ambulatory Visit: Payer: 59 | Attending: Internal Medicine

## 2022-07-28 DIAGNOSIS — R0789 Other chest pain: Secondary | ICD-10-CM | POA: Diagnosis not present

## 2022-07-28 DIAGNOSIS — R0602 Shortness of breath: Secondary | ICD-10-CM | POA: Diagnosis not present

## 2022-07-29 LAB — ECHOCARDIOGRAM COMPLETE
AR max vel: 2.84 cm2
AV Area VTI: 3.02 cm2
AV Area mean vel: 2.91 cm2
AV Mean grad: 3 mmHg
AV Peak grad: 5.7 mmHg
Ao pk vel: 1.19 m/s
Area-P 1/2: 4.06 cm2
Calc EF: 55.7 %
S' Lateral: 3.1 cm
Single Plane A2C EF: 58.8 %
Single Plane A4C EF: 53.3 %

## 2022-08-05 ENCOUNTER — Other Ambulatory Visit: Payer: Self-pay | Admitting: Physical Medicine & Rehabilitation

## 2022-08-05 ENCOUNTER — Other Ambulatory Visit: Payer: Self-pay | Admitting: Internal Medicine

## 2022-08-05 DIAGNOSIS — M5010 Cervical disc disorder with radiculopathy, unspecified cervical region: Secondary | ICD-10-CM

## 2022-08-11 ENCOUNTER — Ambulatory Visit
Admission: RE | Admit: 2022-08-11 | Discharge: 2022-08-11 | Disposition: A | Discharge status: Home / Self Care | Payer: 59 | Source: Ambulatory Visit | Attending: Physical Medicine & Rehabilitation | Admitting: Physical Medicine & Rehabilitation

## 2022-08-11 ENCOUNTER — Inpatient Hospital Stay
Admission: RE | Admit: 2022-08-11 | Discharge: 2022-08-11 | Disposition: A | Discharge status: Home / Self Care | Payer: 59 | Source: Ambulatory Visit | Attending: Physical Medicine & Rehabilitation | Admitting: Physical Medicine & Rehabilitation

## 2022-08-11 DIAGNOSIS — M5010 Cervical disc disorder with radiculopathy, unspecified cervical region: Secondary | ICD-10-CM

## 2022-08-11 MED ORDER — TRIAMCINOLONE ACETONIDE 40 MG/ML IJ SUSP (RADIOLOGY)
60.0000 mg | Freq: Once | INTRAMUSCULAR | Status: AC
  Administered 2022-08-11: 60 mg via EPIDURAL

## 2022-08-11 MED ORDER — IOPAMIDOL (ISOVUE-M 300) INJECTION 61%
1.0000 mL | Freq: Once | INTRAMUSCULAR | Status: AC | PRN
  Administered 2022-08-11: 1 mL via EPIDURAL

## 2022-08-11 NOTE — Discharge Instructions (Signed)

## 2022-08-11 NOTE — Discharge Instructions (Signed)

## 2022-10-15 ENCOUNTER — Other Ambulatory Visit: Payer: Self-pay | Admitting: Internal Medicine

## 2022-10-15 NOTE — Telephone Encounter (Signed)
Requested Prescriptions  Pending Prescriptions Disp Refills   sertraline (ZOLOFT) 100 MG tablet [Pharmacy Med Name: SERTRALINE HCL 100 MG TABLET] 135 tablet 0    Sig: TAKE 1.5 TABLETS (150MG  TOTAL) BY MOUTH DAILY     Psychiatry:  Antidepressants - SSRI - sertraline Failed - 10/15/2022  2:38 AM      Failed - ALT in normal range and within 360 days    ALT  Date Value Ref Range Status  06/01/2022 34 (H) 6 - 29 U/L Final         Passed - AST in normal range and within 360 days    AST  Date Value Ref Range Status  06/01/2022 22 10 - 35 U/L Final         Passed - Completed PHQ-2 or PHQ-9 in the last 360 days      Passed - Valid encounter within last 6 months    Recent Outpatient Visits           4 months ago Elevated liver enzymes   [place] [facility] [place], Salvadore [place], NP   6 months ago Pure hypercholesterolemia   [place] [facility] [place], Salvadore [place], NP   9 months ago Cervical disc herniation   [place] [facility] [place], Salvadore [place], NP   11 months ago Chronic neck pain   [place] [facility] [place], Salvadore Oxford, NP   1 year ago Multiple joint pain   [facility] [place], Salvadore Oxford, [place]

## 2022-11-27 ENCOUNTER — Other Ambulatory Visit: Payer: Self-pay | Admitting: Physical Medicine & Rehabilitation

## 2022-11-27 DIAGNOSIS — M542 Cervicalgia: Secondary | ICD-10-CM

## 2022-12-04 ENCOUNTER — Other Ambulatory Visit: Payer: Self-pay | Admitting: Gastroenterology

## 2022-12-05 ENCOUNTER — Ambulatory Visit
Admission: RE | Admit: 2022-12-05 | Discharge: 2022-12-05 | Disposition: A | Discharge status: Home / Self Care | Payer: 59 | Source: Ambulatory Visit | Attending: Physical Medicine & Rehabilitation | Admitting: Physical Medicine & Rehabilitation

## 2022-12-05 DIAGNOSIS — M542 Cervicalgia: Secondary | ICD-10-CM
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
## 2022-12-22 ENCOUNTER — Ambulatory Visit (INDEPENDENT_AMBULATORY_CARE_PROVIDER_SITE_OTHER): Payer: 59 | Admitting: Orthopedic Surgery

## 2022-12-22 ENCOUNTER — Encounter: Payer: Self-pay | Admitting: Orthopedic Surgery

## 2022-12-22 VITALS — BP 130/72 | Ht 65.0 in | Wt 173.0 lb

## 2022-12-22 DIAGNOSIS — M5412 Radiculopathy, cervical region: Secondary | ICD-10-CM

## 2022-12-22 DIAGNOSIS — M4802 Spinal stenosis, cervical region: Secondary | ICD-10-CM | POA: Diagnosis not present

## 2022-12-22 DIAGNOSIS — M4722 Other spondylosis with radiculopathy, cervical region: Secondary | ICD-10-CM

## 2022-12-22 DIAGNOSIS — M47812 Spondylosis without myelopathy or radiculopathy, cervical region: Secondary | ICD-10-CM

## 2022-12-31 ENCOUNTER — Other Ambulatory Visit: Payer: Self-pay | Admitting: Internal Medicine

## 2023-01-01 ENCOUNTER — Other Ambulatory Visit: Payer: 59

## 2023-01-01 DIAGNOSIS — E782 Mixed hyperlipidemia: Secondary | ICD-10-CM

## 2023-01-01 NOTE — Telephone Encounter (Signed)
Requested medications are due for refill today.  yes  Requested medications are on the active medications list.  yes  Last refill. 07/23/2022 #180 0 rf  Future visit scheduled.   no  Notes to clinic.  Refill not delegated.    Requested Prescriptions  Pending Prescriptions Disp Refills   pregabalin (LYRICA) 75 MG capsule [Pharmacy Med Name: PREGABALIN 75 MG CAPSULE] 180 capsule 0    Sig: TAKE 1 CAPSULE BY MOUTH TWICE A DAY     Not Delegated - Neurology:  Anticonvulsants - Controlled - pregabalin Failed - 12/31/2022  9:10 AM      Failed - This refill cannot be delegated      Passed - Cr in normal range and within 360 days    Creat  Date Value Ref Range Status  06/01/2022 0.97 0.50 - 0.99 mg/dL Final         Passed - Completed PHQ-2 or PHQ-9 in the last 360 days      Passed - Valid encounter within last 12 months    Recent Outpatient Visits           7 months ago Elevated liver enzymes   [place] [facility] [place], Salvadore Oxford, NP   9 months ago Pure hypercholesterolemia   [place] [facility] [place], Salvadore Oxford, NP   1 year ago Cervical disc herniation   [facility] [place], Salvadore Oxford, NP   1 year ago Chronic neck pain   [place] [facility] [place], Salvadore Oxford, NP   1 year ago Multiple joint pain   [place] [facility] [place], Salvadore Oxford, [place]

## 2023-01-05 ENCOUNTER — Telehealth: Payer: Self-pay

## 2023-01-05 NOTE — Telephone Encounter (Signed)
Have not received response from [facility], so I called them. I was told they have been bought out since 2018 and was given another # to call [phone] [employer], which is a medical records company.  I have reached out to the patient to see if she has a copy of the MRI or if we should move forward with contacting the medical records company.

## 2023-01-05 NOTE — Telephone Encounter (Signed)
-----   Message from Nurse Melburn Popper sent at 12/22/2022 11:09 AM EDT ----- Sent invitation to connect w/ facility, pending response ----- Message ----- From: Drake Leach, PA-C Sent: 12/22/2022  11:08 AM EDT To: Sharlot Gowda, RN  She had cervical MRI done around 2018 at [facility]. Can you see if you can get these on powershare?   If not, she may have the CDs.   Thanks!

## 2023-01-11 ENCOUNTER — Ambulatory Visit
Admission: RE | Admit: 2023-01-11 | Discharge: 2023-01-11 | Disposition: A | Discharge status: Home / Self Care | Payer: 59 | Attending: Orthopedic Surgery | Admitting: Orthopedic Surgery

## 2023-01-11 ENCOUNTER — Ambulatory Visit
Admission: RE | Admit: 2023-01-11 | Discharge: 2023-01-11 | Disposition: A | Discharge status: Home / Self Care | Payer: 59 | Source: Ambulatory Visit | Attending: Orthopedic Surgery | Admitting: Orthopedic Surgery

## 2023-01-11 DIAGNOSIS — M5412 Radiculopathy, cervical region: Secondary | ICD-10-CM

## 2023-01-11 DIAGNOSIS — M47812 Spondylosis without myelopathy or radiculopathy, cervical region: Secondary | ICD-10-CM | POA: Insufficient documentation

## 2023-01-12 LAB — LIPID PANEL
Cholesterol: 153 mg/dL (ref <200)
HDL: 56 mg/dL (ref >=50)
LDL Cholesterol (Calc): 75 mg/dL
Non-HDL Cholesterol (Calc): 97 mg/dL (ref <130)
Total CHOL/HDL Ratio: 2.7 (calc) (ref <5.0)
Triglycerides: 141 mg/dL (ref <150)

## 2023-01-19 NOTE — Telephone Encounter (Signed)
She was trying to get old MRI scans. You can schedule now. Only thing pending in her xrays which have been done and should be read in next few days.

## 2023-01-19 NOTE — Telephone Encounter (Signed)
Please schedule her for a NP appt with either Myer Haff or Katrinka Blazing and let me know when it's scheduled.

## 2023-01-20 ENCOUNTER — Other Ambulatory Visit: Payer: Self-pay | Admitting: Internal Medicine

## 2023-01-21 NOTE — Telephone Encounter (Signed)
Requested Prescriptions  Pending Prescriptions Disp Refills   rosuvastatin (CRESTOR) 20 MG tablet [Pharmacy Med Name: ROSUVASTATIN CALCIUM 20 MG TAB] 90 tablet 0    Sig: TAKE 1 TABLET BY MOUTH EVERY DAY     Cardiovascular:  Antilipid - Statins 2 Failed - 01/20/2023  1:27 AM      Failed - Lipid Panel in normal range within the last 12 months    Cholesterol  Date Value Ref Range Status  01/11/2023 153 <200 mg/dL Final   LDL Cholesterol (Calc)  Date Value Ref Range Status  01/11/2023 75 mg/dL (calc) Final    Comment:    Reference range: <100 . Desirable range <100 mg/dL for primary prevention;   <70 mg/dL for patients with CHD or diabetic patients  with > or = 2 CHD risk factors. Marland Kitchen LDL-C is now calculated using the Martin-Hopkins  calculation, which is a validated novel method providing  better accuracy than the Friedewald equation in the  estimation of LDL-C.  Horald Pollen et al. Lenox Ahr. 4782;956(21): 2061-2068  (http://education.[employer].com/faq/FAQ164)    HDL  Date Value Ref Range Status  01/11/2023 56 > OR = 50 mg/dL Final   Triglycerides  Date Value Ref Range Status  01/11/2023 141 <150 mg/dL Final         Passed - Cr in normal range and within 360 days    Creat  Date Value Ref Range Status  06/01/2022 0.97 0.50 - 0.99 mg/dL Final         Passed - Patient is not pregnant      Passed - Valid encounter within last 12 months    Recent Outpatient Visits           7 months ago Elevated liver enzymes   [place] [facility] [place], Salvadore Oxford, NP   9 months ago Pure hypercholesterolemia   [place] [facility] [place], Salvadore Oxford, NP   1 year ago Cervical disc herniation   [place] [facility] [place], Salvadore Oxford, NP   1 year ago Chronic neck pain   [place] [facility] [place], Salvadore Oxford, NP   1 year ago Multiple joint pain   [place] [facility] [place], [place], NP                sertraline (ZOLOFT) 100 MG tablet [Pharmacy Med Name: SERTRALINE HCL 100 MG TABLET] 135 tablet 0    Sig: TAKE 1.5 TABLETS (150 MG TOTAL) BY MOUTH DAILY     Psychiatry:  Antidepressants - SSRI - sertraline Failed - 01/20/2023  1:27 AM      Failed - ALT in normal range and within 360 days    ALT  Date Value Ref Range Status  06/01/2022 34 (H) 6 - 29 U/L Final         Failed - Valid encounter within last 6 months    Recent Outpatient Visits           7 months ago Elevated liver enzymes   [place] [facility] [place], Salvadore Oxford, NP   9 months ago Pure hypercholesterolemia   [place] [facility] [place], Salvadore Oxford, NP   1 year ago Cervical disc herniation   [place] [facility] [place], Salvadore Oxford, NP   1 year ago Chronic neck pain   [place] [facility] [place], Salvadore Oxford, NP   1 year ago Multiple  joint pain   [place] [facility] [place], [place], [place]              Passed - AST in normal range and within 360 days    AST  Date Value Ref Range Status  06/01/2022 22 10 - 35 U/L Final         Passed - Completed PHQ-2 or PHQ-9 in the last 360 days
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
## 2023-01-25 ENCOUNTER — Encounter: Payer: Self-pay | Admitting: Neurosurgery

## 2023-01-25 ENCOUNTER — Ambulatory Visit (INDEPENDENT_AMBULATORY_CARE_PROVIDER_SITE_OTHER): Payer: 59 | Admitting: Neurosurgery

## 2023-01-25 VITALS — BP 127/72 | Ht 65.0 in | Wt 166.0 lb

## 2023-01-25 DIAGNOSIS — M47812 Spondylosis without myelopathy or radiculopathy, cervical region: Secondary | ICD-10-CM

## 2023-01-25 DIAGNOSIS — M5412 Radiculopathy, cervical region: Secondary | ICD-10-CM

## 2023-01-25 DIAGNOSIS — M542 Cervicalgia: Secondary | ICD-10-CM

## 2023-01-26 DIAGNOSIS — M47812 Spondylosis without myelopathy or radiculopathy, cervical region: Secondary | ICD-10-CM | POA: Insufficient documentation

## 2023-02-03 ENCOUNTER — Encounter: Payer: Self-pay | Admitting: Neurosurgery

## 2023-02-03 DIAGNOSIS — M5412 Radiculopathy, cervical region: Secondary | ICD-10-CM

## 2023-02-03 DIAGNOSIS — M47812 Spondylosis without myelopathy or radiculopathy, cervical region: Secondary | ICD-10-CM

## 2023-02-03 NOTE — Telephone Encounter (Signed)
Referral has been placed to [facility] per patient request

## 2023-03-01 ENCOUNTER — Ambulatory Visit: Payer: 59 | Admitting: Internal Medicine

## 2023-03-01 NOTE — Progress Notes (Deleted)
Subjective:    Patient ID: Jean Rios, female    DOB: [birth year], 47 y.o.   MRN: [id number]  HPI  Patient presents to clinic today for her annual exam.  Flu: 03/2022 Tetanus:  COVID: X 1 Pap smear: Hysterectomy Mammogram: Colon screening: 07/2021 Vision screening: Dentist:  Diet: Exercise:   Review of Systems     Past Medical History:  Diagnosis Date   Anemia    Anxiety    Chronic back pain    Depression    Fibromyalgia    Frequent headaches    Hyperlipidemia    Kidney stone    Nerve damage    POTS (postural orthostatic tachycardia syndrome)    Stroke (HCC)     Current Outpatient Medications  Medication Sig Dispense Refill   aspirin EC 81 MG tablet Take 1 tablet (81 mg total) by mouth daily. Swallow whole. 30 tablet 12   celecoxib (CELEBREX) 50 MG capsule TAKE 1 CAPSULE BY MOUTH TWICE A DAY 180 capsule 0   Cholecalciferol (VITAMIN D-3) 125 MCG (5000 UT) TABS Take 1 capsule by mouth daily.     cyclobenzaprine (FLEXERIL) 10 MG tablet TAKE 1 TABLET BY MOUTH THREE TIMES A DAY AS NEEDED FOR MUSCLE SPASM 90 tablet 0   diclofenac Sodium (VOLTAREN ARTHRITIS PAIN) 1 % GEL Apply topically as needed.     levocetirizine (XYZAL) 5 MG tablet Take 5 mg by mouth every evening.     mometasone (ELOCON) 0.1 % cream Apply 1 Application topically at bedtime as needed (break through hives).     Multiple Vitamins-Minerals (ONE-A-DAY WOMENS PO) Take 1 tablet by mouth daily.     omeprazole (PRILOSEC) 40 MG capsule Take 40 mg by mouth 2 (two) times daily as needed (heartburn). 180 capsule 1   pregabalin (LYRICA) 75 MG capsule TAKE 1 CAPSULE BY MOUTH TWICE A DAY 180 capsule 0   rosuvastatin (CRESTOR) 20 MG tablet TAKE 1 TABLET BY MOUTH EVERY DAY 90 tablet 0   sertraline (ZOLOFT) 100 MG tablet TAKE 1.5 TABLETS (150 MG TOTAL) BY MOUTH DAILY 135 tablet 0   No current facility-administered medications for this visit.    Allergies  Allergen Reactions   Fentanyl Other (See  Comments), Shortness Of Breath and Nausea And Vomiting    Chest tightness  Passes out  "Feels like dying"  States she will become lightheaded with SOB, then will pass out.    Chest tightness Passes out "Feels like dying"   Dust Mite Extract Hives and Itching   Mixed Feathers Itching    Family History  Problem Relation Age of Onset   Diabetes Mellitus II Maternal Grandfather     Social History   Socioeconomic History   Marital status: Married    Spouse name: Not on file   Number of children: Not on file   Years of education: Not on file   Highest education level: Not on file  Occupational History   Not on file  Tobacco Use   Smoking status: Former    Types: Cigarettes   Smokeless tobacco: Former    Types: Snuff  Substance and Sexual Activity   Alcohol use: Yes    Alcohol/week: 3.0 standard drinks of alcohol    Types: 3 Standard drinks or equivalent per week   Drug use: Not Currently   Sexual activity: Yes  Other Topics Concern   Not on file  Social History Narrative   Not on file   Social Determinants of Health   Financial Resource  Strain: Not on file  Food Insecurity: Not on file  Transportation Needs: Not on file  Physical Activity: Not on file  Stress: Not on file  Social Connections: Unknown (02/11/2023)   [facility]   Social Network    Social Network: Not on file  Intimate Partner Violence: Unknown (02/11/2023)   Received from [facility]   HITS    Physically Hurt: Not on file    Insult or Talk Down To: Not on file    Threaten Physical Harm: Not on file    Scream or Curse: Not on file     Constitutional: Denies fever, malaise, fatigue, headache or abrupt weight changes.  HEENT: Denies eye pain, eye redness, ear pain, ringing in the ears, wax buildup, runny nose, nasal congestion, bloody nose, or sore throat. Respiratory: Denies difficulty breathing, shortness of breath, cough or sputum production.   Cardiovascular: Denies  chest pain, chest tightness, palpitations or swelling in the hands or feet.  Gastrointestinal: Denies abdominal pain, bloating, constipation, diarrhea or blood in the stool.  GU: Denies urgency, frequency, pain with urination, burning sensation, blood in urine, odor or discharge. Musculoskeletal: Patient reports chronic joint and muscle pain.  Denies decrease in range of motion, difficulty with gait, or joint swelling.  Skin: Denies redness, rashes, lesions or ulcercations.  Neurological: Patient reports neuropathic pain, insomnia.  Denies dizziness, difficulty with memory, difficulty with speech or problems with balance and coordination.  Psych: Patient has a history of anxiety and depression.  Denies SI/HI.  No other specific complaints in a complete review of systems (except as listed in HPI above).  Objective:   Physical Exam  LMP  (LMP Unknown)  Wt Readings from Last 3 Encounters:  01/25/23 166 lb (75.3 kg)  12/22/22 173 lb (78.5 kg)  07/14/22 173 lb (78.5 kg)    General: Appears their stated age, well developed, well nourished in NAD. Skin: Warm, dry and intact. No rashes, lesions or ulcerations noted. HEENT: Head: normal shape and size; Eyes: sclera white, no icterus, conjunctiva pink, PERRLA and EOMs intact; Ears: Tm's gray and intact, normal light reflex; Nose: mucosa pink and moist, septum midline; Throat/Mouth: Teeth present, mucosa pink and moist, no exudate, lesions or ulcerations noted.  Neck:  Neck supple, trachea midline. No masses, lumps or thyromegaly present.  Cardiovascular: Normal rate and rhythm. S1,S2 noted.  No murmur, rubs or gallops noted. No JVD or BLE edema. No carotid bruits noted. Pulmonary/Chest: Normal effort and positive vesicular breath sounds. No respiratory distress. No wheezes, rales or ronchi noted.  Abdomen: Soft and nontender. Normal bowel sounds. No distention or masses noted. Liver, spleen and kidneys non palpable. Musculoskeletal: Normal range of  motion. No signs of joint swelling. No difficulty with gait.  Neurological: Alert and oriented. Cranial nerves II-XII grossly intact. Coordination normal.  Psychiatric: Mood and affect normal. Behavior is normal. Judgment and thought content normal.     BMET    Component Value Date/Time   NA 141 06/01/2022 1143   K 4.3 06/01/2022 1143   CL 104 06/01/2022 1143   CO2 21 06/01/2022 1143   GLUCOSE 99 06/01/2022 1143   BUN 18 06/01/2022 1143   CREATININE 0.97 06/01/2022 1143   CALCIUM 9.7 06/01/2022 1143   GFRNONAA >60 12/14/2021 0157    Lipid Panel     Component Value Date/Time   CHOL 153 01/11/2023 0753   TRIG 141 01/11/2023 0753   HDL 56 01/11/2023 0753   CHOLHDL 2.7 01/11/2023 0753  LDLCALC 75 01/11/2023 0753    CBC    Component Value Date/Time   WBC 4.7 03/30/2022 1124   RBC 4.40 03/30/2022 1124   HGB 13.1 03/30/2022 1124   HCT 38.4 03/30/2022 1124   PLT 287 03/30/2022 1124   MCV 87.3 03/30/2022 1124   MCH 29.8 03/30/2022 1124   MCHC 34.1 03/30/2022 1124   RDW 12.5 03/30/2022 1124   LYMPHSABS 2.3 12/13/2021 0943   MONOABS 0.7 12/13/2021 0943   EOSABS 0.2 12/13/2021 0943   BASOSABS 0.0 12/13/2021 0943    Hgb A1C Lab Results  Component Value Date   HGBA1C 5.2 07/07/2021           Assessment & Plan:    Preventative health maintenance:  Flu Tetanus Encouraged her to get her COVID booster She no longer needs to screen for cervical cancer Mammogram ordered-she will call to schedule Colon screening UTD Encouraged her to consume a balanced diet and exercise regimen Advised her to see an eye doctor and dentist annually We will check CBC, c-Met, lipid, A1c today  RTC in 6 months, follow-up chronic conditions Nicki Reaper, NP

## 2023-03-03 IMAGING — DX DG KNEE COMPLETE 4+V*L*
4 series · 4 of 4 positions shown · non-contrast
Comparison: None Available.

CLINICAL DATA: Bilateral knee pain.

EXAM:
LEFT KNEE - COMPLETE 4+ VIEW

[knee ap]
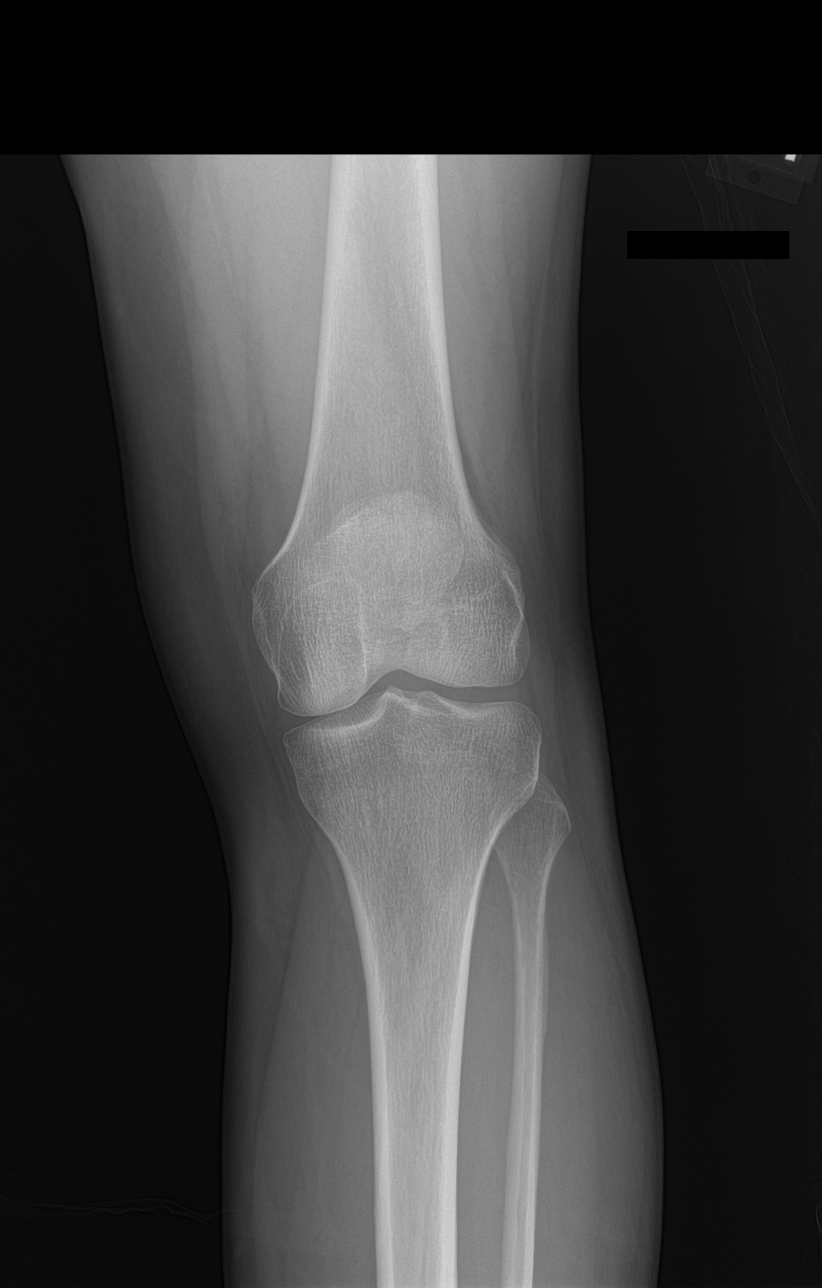

[knee lat]
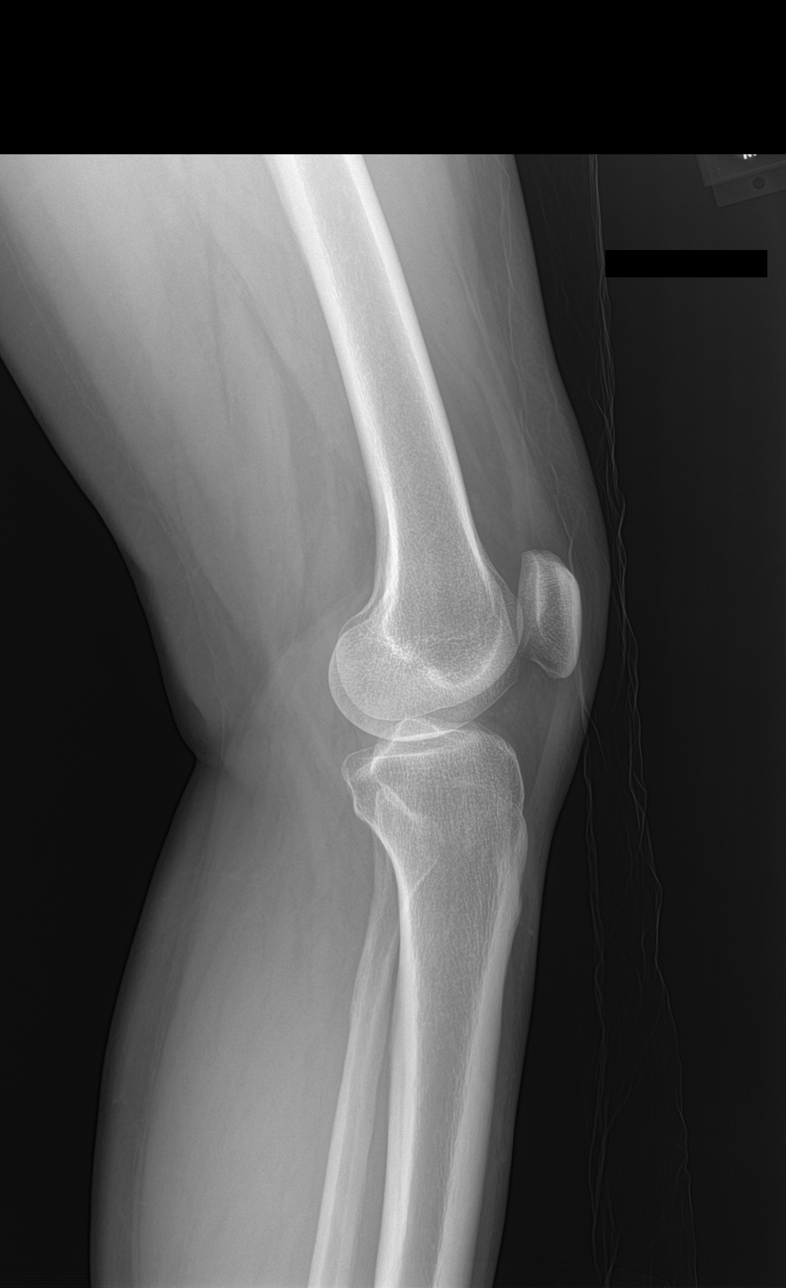

[knee tunnel]
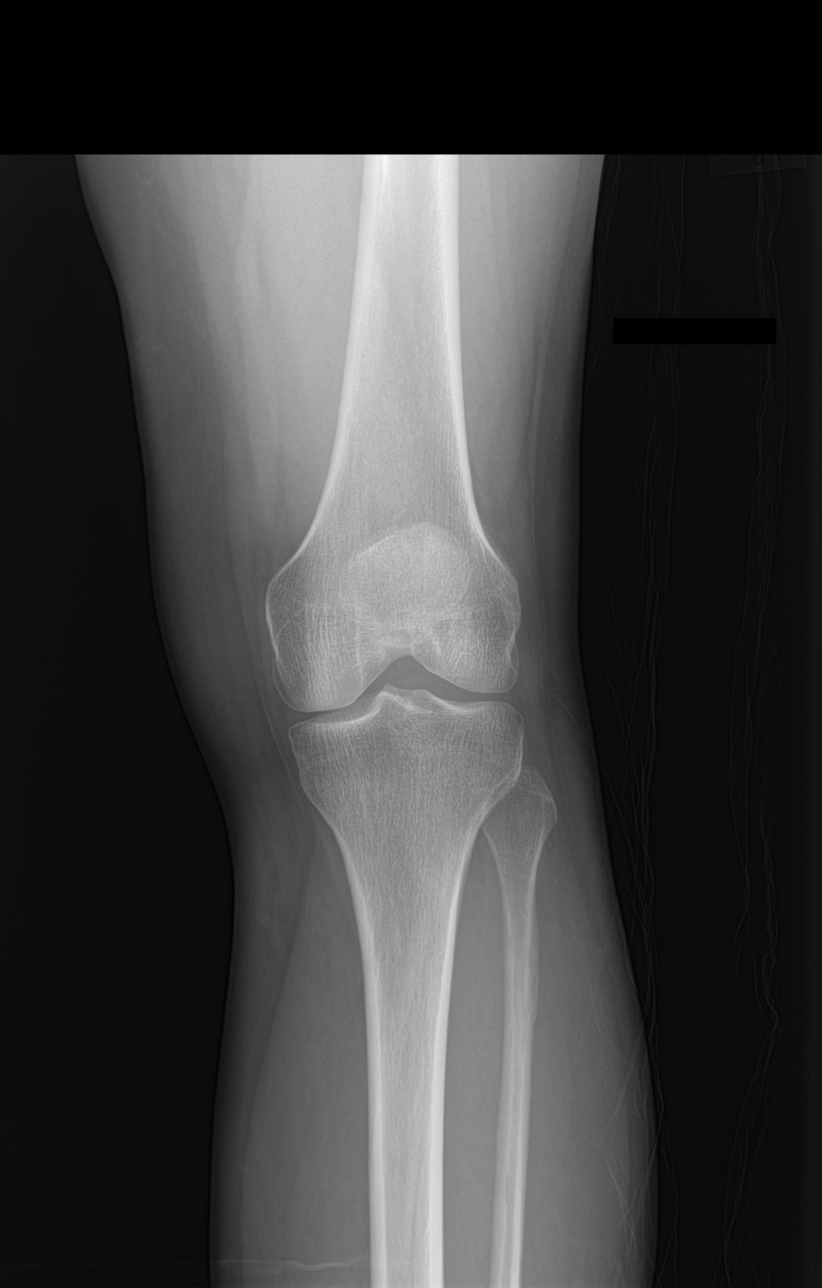

[patella skyline]
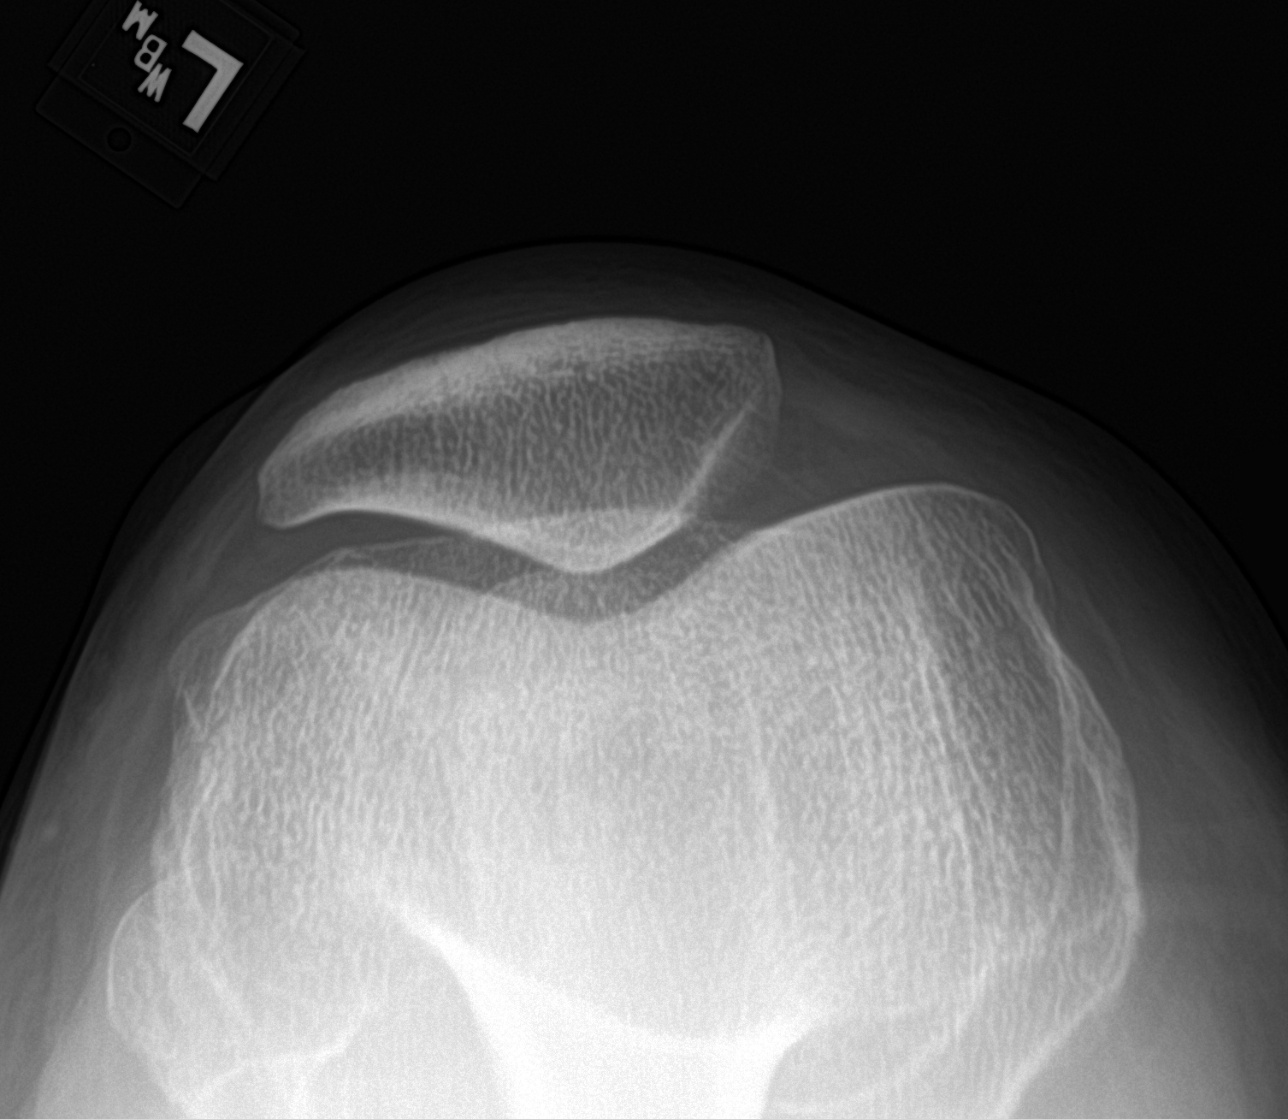

[4 of 4 positions shown; findings below may reference images not displayed]

FINDINGS: Mild medial compartment joint space narrowing. No joint effusion. No
acute fracture or dislocation. Normal bone mineralization.
IMPRESSION: Mild medial compartment joint space narrowing.

## 2023-03-03 IMAGING — DX DG KNEE COMPLETE 4+V*R*
4 series · 4 of 4 positions shown · non-contrast
Comparison: None Available.

CLINICAL DATA: Bilateral knee pain

EXAM:
RIGHT KNEE - COMPLETE 4+ VIEW

[knee ap]
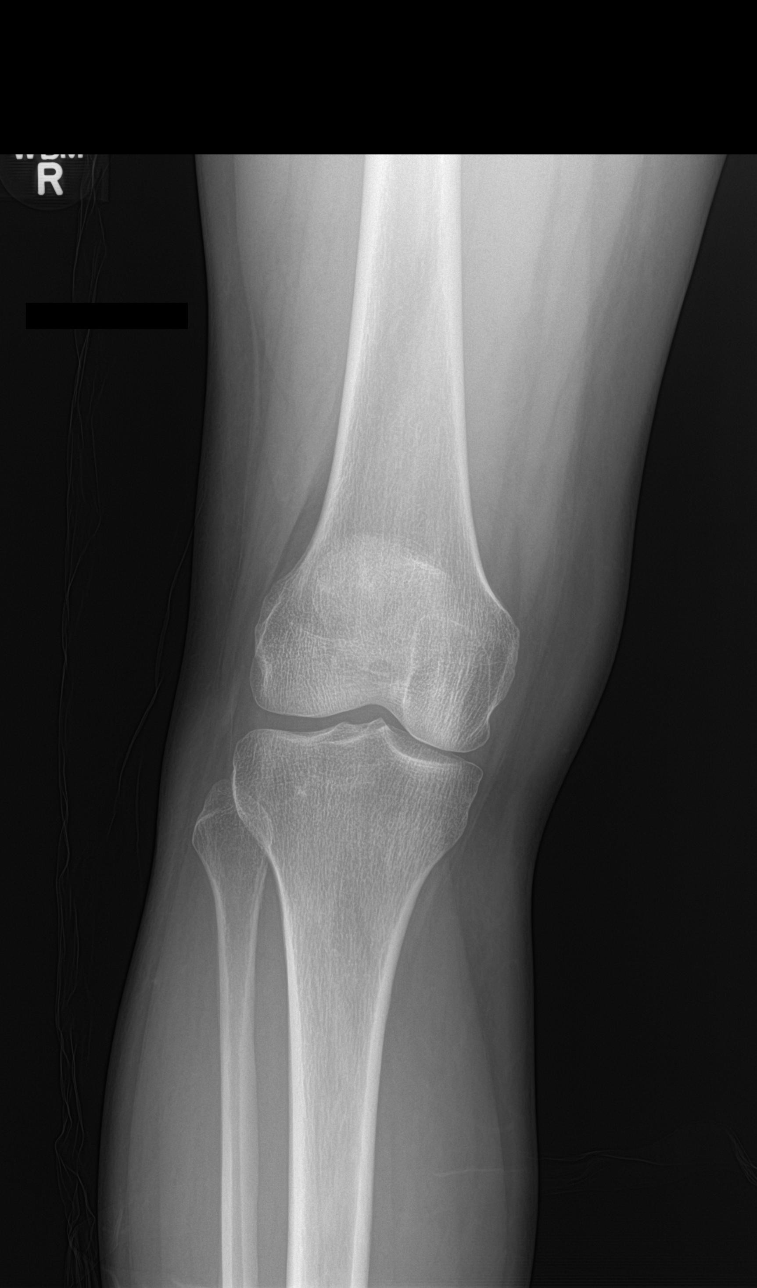

[knee lat]
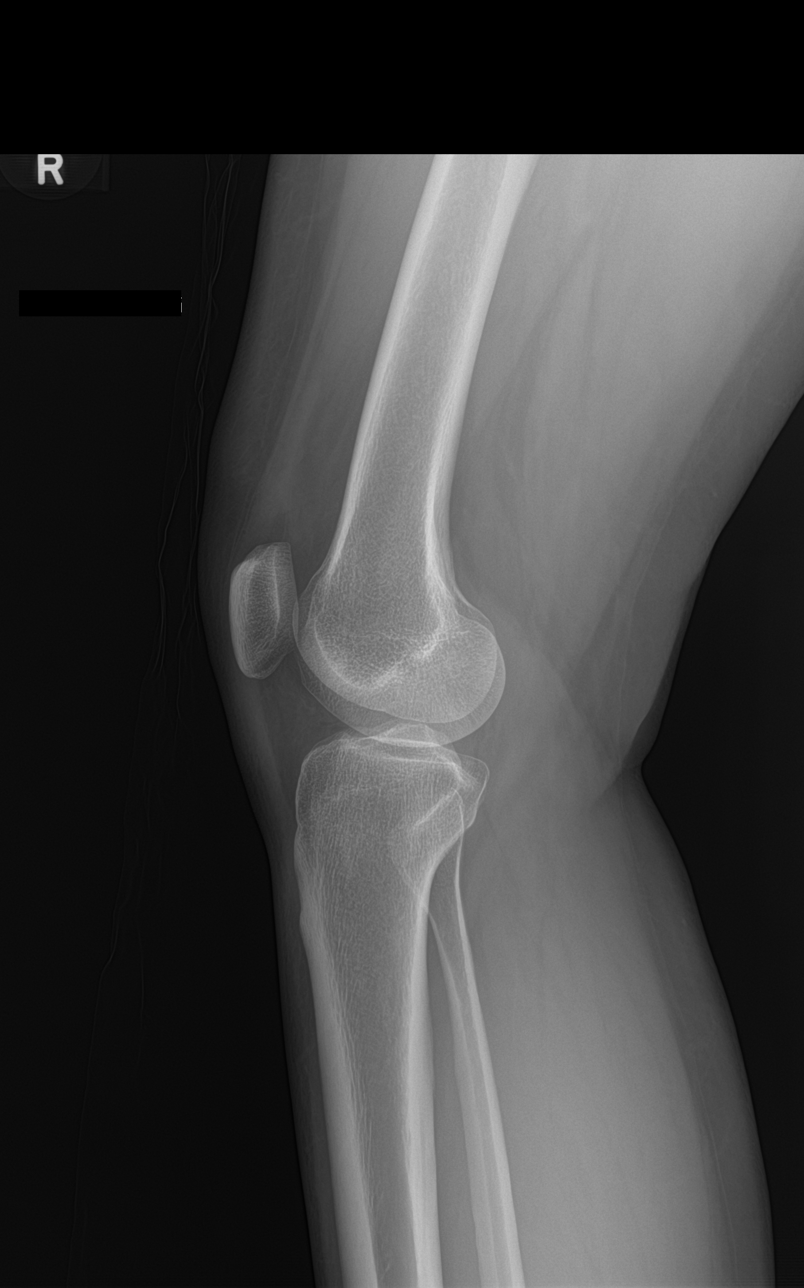

[knee tunnel]
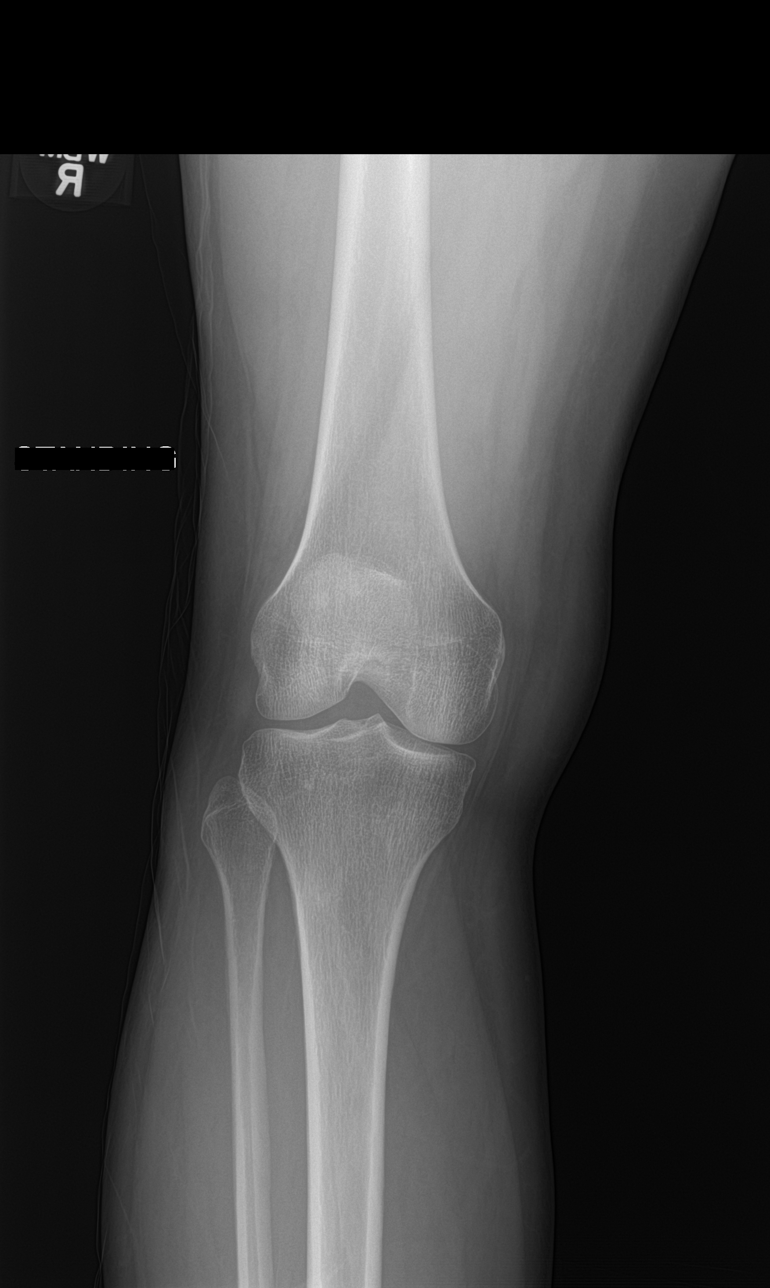

[patella skyline]
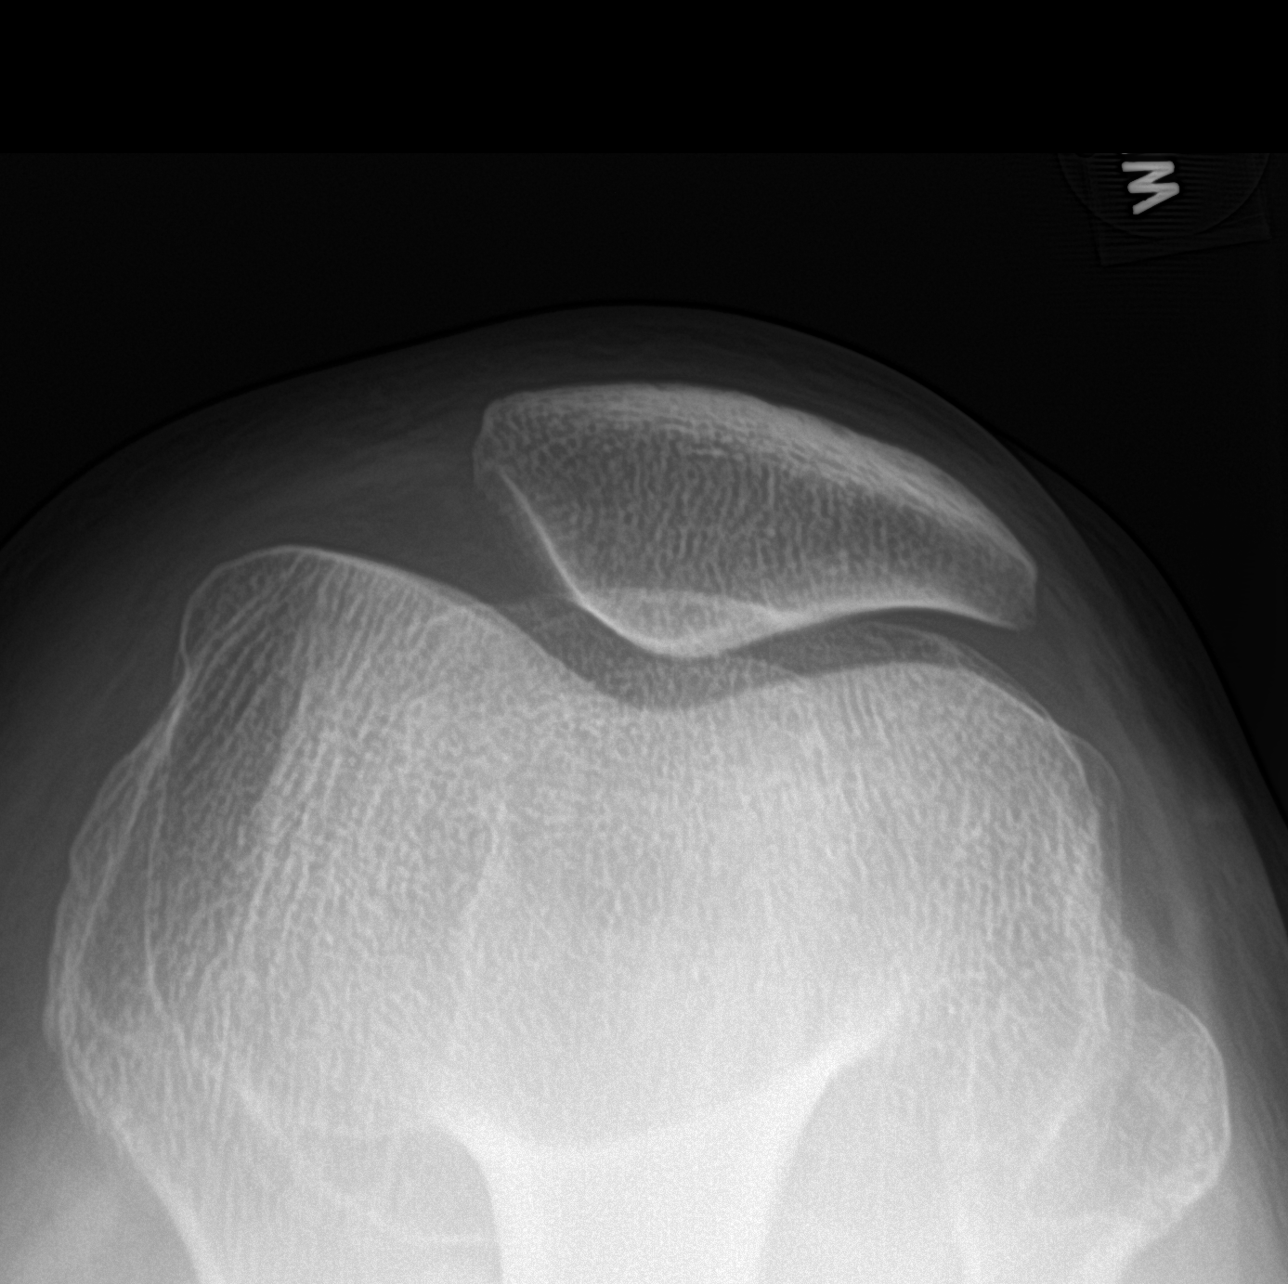

[4 of 4 positions shown; findings below may reference images not displayed]

FINDINGS: Mild medial compartment joint space narrowing. Normal bone
mineralization. No acute fracture or dislocation. No joint effusion.
IMPRESSION: Mild medial compartment joint space narrowing.

## 2023-05-03 ENCOUNTER — Ambulatory Visit (INDEPENDENT_AMBULATORY_CARE_PROVIDER_SITE_OTHER): Payer: 59 | Admitting: Neurosurgery

## 2023-05-03 ENCOUNTER — Encounter: Payer: Self-pay | Admitting: Neurosurgery

## 2023-05-03 VITALS — BP 116/74 | Ht 65.0 in | Wt 166.0 lb

## 2023-05-03 DIAGNOSIS — G5602 Carpal tunnel syndrome, left upper limb: Secondary | ICD-10-CM | POA: Diagnosis not present

## 2023-05-03 DIAGNOSIS — G8929 Other chronic pain: Secondary | ICD-10-CM

## 2023-05-03 DIAGNOSIS — R519 Headache, unspecified: Secondary | ICD-10-CM

## 2023-05-03 DIAGNOSIS — M542 Cervicalgia: Secondary | ICD-10-CM

## 2023-05-03 DIAGNOSIS — M549 Dorsalgia, unspecified: Secondary | ICD-10-CM

## 2023-05-03 DIAGNOSIS — M797 Fibromyalgia: Secondary | ICD-10-CM

## 2023-05-03 DIAGNOSIS — M47812 Spondylosis without myelopathy or radiculopathy, cervical region: Secondary | ICD-10-CM
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
## 2023-07-30 ENCOUNTER — Other Ambulatory Visit: Payer: Self-pay | Admitting: Physical Medicine & Rehabilitation

## 2023-07-30 DIAGNOSIS — G834 Cauda equina syndrome: Secondary | ICD-10-CM

## 2023-08-07 ENCOUNTER — Ambulatory Visit
Admission: RE | Admit: 2023-08-07 | Discharge: 2023-08-07 | Disposition: A | Discharge status: Home / Self Care | Source: Ambulatory Visit | Attending: Physical Medicine & Rehabilitation | Admitting: Physical Medicine & Rehabilitation

## 2023-08-07 DIAGNOSIS — G834 Cauda equina syndrome: Secondary | ICD-10-CM

## 2023-09-03 ENCOUNTER — Encounter (HOSPITAL_COMMUNITY): Payer: Self-pay | Admitting: *Deleted

## 2023-09-03 ENCOUNTER — Emergency Department (HOSPITAL_COMMUNITY)
Admission: EM | Admit: 2023-09-03 | Discharge: 2023-09-04 | Disposition: A | Discharge status: Home / Self Care | Attending: Emergency Medicine | Admitting: Emergency Medicine

## 2023-09-03 ENCOUNTER — Other Ambulatory Visit: Payer: Self-pay

## 2023-09-03 DIAGNOSIS — Z7982 Long term (current) use of aspirin: Secondary | ICD-10-CM | POA: Insufficient documentation

## 2023-09-03 DIAGNOSIS — R110 Nausea: Secondary | ICD-10-CM | POA: Insufficient documentation

## 2023-09-03 DIAGNOSIS — R531 Weakness: Secondary | ICD-10-CM | POA: Insufficient documentation

## 2023-09-03 DIAGNOSIS — M542 Cervicalgia: Secondary | ICD-10-CM | POA: Insufficient documentation

## 2023-09-03 DIAGNOSIS — H53149 Visual discomfort, unspecified: Secondary | ICD-10-CM | POA: Diagnosis not present

## 2023-09-03 DIAGNOSIS — R519 Headache, unspecified: Secondary | ICD-10-CM | POA: Insufficient documentation

## 2023-09-03 LAB — CBC WITH DIFFERENTIAL/PLATELET
Abs Immature Granulocytes: 0.01 10*3/uL (ref 0.00–0.07)
Basophils Absolute: 0 10*3/uL (ref 0.0–0.1)
Basophils Relative: 1 %
Eosinophils Absolute: 0.2 10*3/uL (ref 0.0–0.5)
Eosinophils Relative: 4 %
HCT: 42 % (ref 36.0–46.0)
Hemoglobin: 14.1 g/dL (ref 12.0–15.0)
Immature Granulocytes: 0 %
Lymphocytes Relative: 35 %
Lymphs Abs: 1.6 10*3/uL (ref 0.7–4.0)
MCH: 29.9 pg (ref 26.0–34.0)
MCHC: 33.6 g/dL (ref 30.0–36.0)
MCV: 89 fL (ref 80.0–100.0)
Monocytes Absolute: 0.6 10*3/uL (ref 0.1–1.0)
Monocytes Relative: 12 %
Neutro Abs: 2.3 10*3/uL (ref 1.7–7.7)
Neutrophils Relative %: 48 %
Platelets: 245 10*3/uL (ref 150–400)
RBC: 4.72 MIL/uL (ref 3.87–5.11)
RDW: 12 % (ref 11.5–15.5)
WBC: 4.7 10*3/uL (ref 4.0–10.5)
nRBC: 0 % (ref 0.0–0.2)

## 2023-09-03 MED ORDER — DIPHENHYDRAMINE HCL 50 MG/ML IJ SOLN
25.0000 mg | Freq: Once | INTRAMUSCULAR | Status: AC
  Administered 2023-09-03: 25 mg via INTRAVENOUS
  Filled 2023-09-03: qty 1

## 2023-09-03 MED ORDER — ONDANSETRON HCL 4 MG/2ML IJ SOLN
4.0000 mg | Freq: Once | INTRAMUSCULAR | Status: AC
  Administered 2023-09-03: 4 mg via INTRAVENOUS
  Filled 2023-09-03: qty 2

## 2023-09-03 MED ORDER — METOCLOPRAMIDE HCL 5 MG/ML IJ SOLN
10.0000 mg | Freq: Once | INTRAMUSCULAR | Status: AC
  Administered 2023-09-03: 10 mg via INTRAVENOUS
  Filled 2023-09-03: qty 2

## 2023-09-03 MED ORDER — KETOROLAC TROMETHAMINE 15 MG/ML IJ SOLN
15.0000 mg | Freq: Once | INTRAMUSCULAR | Status: AC
  Administered 2023-09-03: 15 mg via INTRAVENOUS
  Filled 2023-09-03: qty 1

## 2023-09-03 MED ORDER — OXYCODONE-ACETAMINOPHEN 5-325 MG PO TABS
1.0000 | ORAL_TABLET | Freq: Once | ORAL | Status: AC
  Administered 2023-09-03: 1 via ORAL
  Filled 2023-09-03: qty 1

## 2023-09-03 NOTE — ED Triage Notes (Signed)
 The pt has had neck pain for 2 days then she started having a migraine headache that none of her usual meds are helping  lmp none

## 2023-09-03 NOTE — ED Provider Notes (Signed)
 [facility] Provider Note   CSN: [id number] Arrival date & time: 09/03/23  1602     History {Add pertinent medical, surgical, social history, OB history to HPI:1} Chief Complaint  Patient presents with   Neck Pain   Migraine    Jean Rios is a 48 y.o. female.  Patient with a history of POTS, fibromyalgia, chronic back and neck pain, anxiety, anemia presents with headache.  States she has a diffuse headache for the past 3 days that is gradual in onset similar to previous migraines are more severe.  Taking ibuprofen  without relief.  She has chronic neck pain at baseline which is unchanged.  Pain radiates from her head down to her neck.  Associated with weakness all over.  But no focal weakness, numbness or tingling.  No fever.  No vomiting.  No chest pain or shortness of breath.  No blurry vision or double vision.  Does have photophobia.  Nausea but no vomiting.  No chest pain or shortness of breath.  Headache is gradual onset she denies thunderclap.  Similar to previous migraines and more severe.  The history is provided by the patient.  Neck Pain Associated symptoms: headaches   Associated symptoms: no chest pain, no fever and no weakness   Migraine Associated symptoms include headaches. Pertinent negatives include no chest pain, no abdominal pain and no shortness of breath.       Home Medications Prior to Admission medications   Medication Sig Start Date End Date Taking? Authorizing Provider  aspirin  EC 81 MG tablet Take 1 tablet (81 mg total) by mouth daily. Swallow whole. 03/30/22   Carollynn Cirri, NP  celecoxib  (CELEBREX ) 50 MG capsule TAKE 1 CAPSULE BY MOUTH TWICE A DAY 07/20/22   Carollynn Cirri, NP  Cholecalciferol (VITAMIN D -3) 125 MCG (5000 UT) TABS Take 1 capsule by mouth daily.    [provider]  cyclobenzaprine  (FLEXERIL ) 10 MG tablet TAKE 1 TABLET BY MOUTH THREE TIMES A DAY AS NEEDED FOR MUSCLE SPASM  05/06/22   Carollynn Cirri, NP  diclofenac Sodium (VOLTAREN ARTHRITIS PAIN) 1 % GEL Apply topically as needed.    [provider]  levocetirizine (XYZAL ) 5 MG tablet Take 5 mg by mouth every evening. 06/12/21   [provider]  mometasone (ELOCON) 0.1 % cream Apply 1 Application topically at bedtime as needed (break through hives). 06/05/21   [provider]  Multiple Vitamins-Minerals (ONE-A-DAY WOMENS PO) Take 1 tablet by mouth daily.    [provider]  omeprazole  (PRILOSEC) 40 MG capsule Take 40 mg by mouth 2 (two) times daily as needed (heartburn). 07/03/21   Carollynn Cirri, NP  pregabalin  (LYRICA ) 75 MG capsule TAKE 1 CAPSULE BY MOUTH TWICE A DAY 07/23/22   Carollynn Cirri, NP  rosuvastatin  (CRESTOR ) 20 MG tablet TAKE 1 TABLET BY MOUTH EVERY DAY 01/21/23   Carollynn Cirri, NP  sertraline  (ZOLOFT ) 100 MG tablet TAKE 1.5 TABLETS (150 MG TOTAL) BY MOUTH DAILY 01/21/23   Carollynn Cirri, NP      Allergies    Fentanyl, Dust mite extract, and Mixed feathers    Review of Systems   Review of Systems  Constitutional:  Negative for activity change, appetite change and fever.  HENT:  Negative for congestion and rhinorrhea.   Respiratory:  Negative for cough, chest tightness and shortness of breath.   Cardiovascular:  Negative for chest pain.  Gastrointestinal:  Positive for nausea. Negative  for abdominal pain and vomiting.  Genitourinary:  Negative for dysuria and hematuria.  Musculoskeletal:  Positive for neck pain.  Skin:  Negative for rash.  Neurological:  Positive for headaches. Negative for dizziness, weakness and light-headedness.   all other systems are negative except as noted in the HPI and PMH.    Physical Exam Updated Vital Signs BP 118/80 (BP Location: Right Arm)   Pulse 68   Temp 98.1 F (36.7 C) (Oral)   Resp 16   Ht 5\' 5"  (1.651 m)   Wt 75.3 kg   LMP  (LMP Unknown)   SpO2 96%   BMI 27.62 kg/m  Physical Exam Vitals and nursing note  reviewed.  Constitutional:      General: She is not in acute distress.    Appearance: She is well-developed.  HENT:     Head: Normocephalic and atraumatic.     Mouth/Throat:     Pharynx: No oropharyngeal exudate.  Eyes:     Conjunctiva/sclera: Conjunctivae normal.     Pupils: Pupils are equal, round, and reactive to light.  Neck:     Comments: Paraspinal cervical tenderness, no midline tenderness Cardiovascular:     Rate and Rhythm: Normal rate and regular rhythm.     Heart sounds: Normal heart sounds. No murmur heard. Pulmonary:     Effort: Pulmonary effort is normal. No respiratory distress.     Breath sounds: Normal breath sounds.  Abdominal:     Palpations: Abdomen is soft.     Tenderness: There is no abdominal tenderness. There is no guarding or rebound.  Musculoskeletal:        General: No tenderness. Normal range of motion.     Cervical back: Normal range of motion and neck supple.  Skin:    General: Skin is warm.  Neurological:     Mental Status: She is alert and oriented to person, place, and time.     Cranial Nerves: No cranial nerve deficit.     Motor: No abnormal muscle tone.     Coordination: Coordination normal.     Comments: No ataxia on finger to nose bilaterally. No pronator drift. 5/5 strength throughout. CN 2-12 intact.Equal grip strength. Sensation intact.   Psychiatric:        Behavior: Behavior normal.     ED Results / Procedures / Treatments   Labs (all labs ordered are listed, but only abnormal results are displayed) Labs Reviewed  RESP PANEL BY RT-PCR (RSV, FLU A&B, COVID)  RVPGX2  CBC WITH DIFFERENTIAL/PLATELET  BASIC METABOLIC PANEL WITH GFR    EKG None  Radiology No results found.  Procedures Procedures  {Document cardiac monitor, telemetry assessment procedure when appropriate:1}  Medications Ordered in ED Medications  metoCLOPramide  (REGLAN ) injection 10 mg (has no administration in time range)  diphenhydrAMINE  (BENADRYL )  injection 25 mg (has no administration in time range)  ondansetron  (ZOFRAN ) injection 4 mg (has no administration in time range)  ketorolac  (TORADOL ) 15 MG/ML injection 15 mg (has no administration in time range)  oxyCODONE -acetaminophen  (PERCOCET/ROXICET) 5-325 MG per tablet 1 tablet (1 tablet Oral Given 09/03/23 1701)    ED Course/ Medical Decision Making/ A&P   {   Click here for ABCD2, HEART and other calculatorsREFRESH Note before signing :1}                              Medical Decision Making Amount and/or Complexity of Data Reviewed Labs: ordered. Decision-making details  documented in ED Course. Radiology: ordered and independent interpretation performed. Decision-making details documented in ED Course. ECG/medicine tests: ordered and independent interpretation performed. Decision-making details documented in ED Course.  Risk Prescription drug management.   3 days a gradual onset headache with nausea, photophobia and neck pain.  No fever.  No focal neurological deficits.  No meningismus.  Low suspicion for subarachnoid hemorrhage, meningitis, temporal arteritis.  Will hydrate, treat symptoms.  {Document critical care time when appropriate:1} {Document review of labs and clinical decision tools ie heart score, Chads2Vasc2 etc:1}  {Document your independent review of radiology images, and any outside records:1} {Document your discussion with family members, caretakers, and with consultants:1} {Document social determinants of health affecting pt's care:1} {Document your decision making why or why not admission, treatments were needed:1} Final Clinical Impression(s) / ED Diagnoses Final diagnoses:  None    Rx / DC Orders ED Discharge Orders     None

## 2023-09-04 ENCOUNTER — Emergency Department (HOSPITAL_COMMUNITY)

## 2023-09-04 LAB — BASIC METABOLIC PANEL WITH GFR
Anion gap: 10 (ref 5–15)
BUN: 11 mg/dL (ref 6–20)
CO2: 24 mmol/L (ref 22–32)
Calcium: 9.1 mg/dL (ref 8.9–10.3)
Chloride: 100 mmol/L (ref 98–111)
Creatinine, Ser: 0.88 mg/dL (ref 0.44–1.00)
GFR, Estimated: >60 mL/min (ref >60)
Glucose, Bld: 103 mg/dL — ABNORMAL HIGH (ref 70–99)
Potassium: 3.2 mmol/L — ABNORMAL LOW (ref 3.5–5.1)
Sodium: 134 mmol/L — ABNORMAL LOW (ref 135–145)

## 2023-09-04 LAB — RESP PANEL BY RT-PCR (RSV, FLU A&B, COVID)  RVPGX2
Influenza A by PCR: NEGATIVE
Influenza B by PCR: NEGATIVE
Resp Syncytial Virus by PCR: NEGATIVE
SARS Coronavirus 2 by RT PCR: NEGATIVE

## 2023-09-04 MED ORDER — HYDROMORPHONE HCL 1 MG/ML IJ SOLN
1.0000 mg | Freq: Once | INTRAMUSCULAR | Status: AC
  Administered 2023-09-04: 1 mg via INTRAVENOUS
  Filled 2023-09-04: qty 1

## 2023-09-04 MED ORDER — VALPROATE SODIUM 100 MG/ML IV SOLN
500.0000 mg | Freq: Once | INTRAVENOUS | Status: AC
  Administered 2023-09-04: 500 mg via INTRAVENOUS
  Filled 2023-09-04: qty 5

## 2023-09-04 MED ORDER — DEXAMETHASONE SODIUM PHOSPHATE 10 MG/ML IJ SOLN
10.0000 mg | Freq: Once | INTRAMUSCULAR | Status: AC
  Administered 2023-09-04: 10 mg via INTRAVENOUS
  Filled 2023-09-04: qty 1

## 2023-09-04 MED ORDER — IOHEXOL 350 MG/ML SOLN
75.0000 mL | Freq: Once | INTRAVENOUS | Status: AC | PRN
  Administered 2023-09-04: 75 mL via INTRAVENOUS

## 2023-09-04 NOTE — ED Notes (Signed)
 Pharmacy notified on patient's Depacon IV order.

## 2023-09-04 NOTE — ED Notes (Signed)
 Pt gone to CT

## 2023-09-04 NOTE — Discharge Instructions (Signed)
 Testing is reassuring.  CT head is normal.  Follow-up with a neurologist next week.  No evidence of meningitis or vascular problem today.  Return to the ED with atypical headache, unilateral weakness, numbness, tingling, difficulty speaking, difficulty swallowing, fever or other concerns.

## 2023-09-04 NOTE — ED Notes (Signed)
 Patient transported to CT scan .

## 2023-09-15 ENCOUNTER — Ambulatory Visit: Admitting: Neurology

## 2023-09-16 ENCOUNTER — Ambulatory Visit: Admitting: Neurology

## 2023-10-07 ENCOUNTER — Telehealth: Payer: Self-pay

## 2023-10-07 ENCOUNTER — Encounter: Payer: Self-pay | Admitting: Diagnostic Neuroimaging

## 2023-10-07 ENCOUNTER — Ambulatory Visit: Admitting: Diagnostic Neuroimaging

## 2023-10-07 ENCOUNTER — Other Ambulatory Visit (HOSPITAL_COMMUNITY): Payer: Self-pay

## 2023-10-07 VITALS — BP 111/74 | HR 62 | Ht 65.0 in | Wt 165.0 lb

## 2023-10-07 DIAGNOSIS — G43E09 Chronic migraine with aura, not intractable, without status migrainosus: Secondary | ICD-10-CM

## 2023-10-07 MED ORDER — NURTEC 75 MG PO TBDP: 75.0000 mg | ORAL_TABLET | Freq: Every day | ORAL | 6 refills | Status: DC | PRN

## 2023-10-07 MED ORDER — AJOVY 225 MG/1.5ML ~~LOC~~ SOAJ: 225.0000 mg | SUBCUTANEOUS | 6 refills | Status: DC
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
## 2023-10-07 NOTE — Telephone Encounter (Signed)
 Pharmacy Patient Advocate Encounter   Received notification from CoverMyMeds that prior authorization for Nurtec 75MG  dispersible tablets is required/requested.   Insurance verification completed.   The patient is insured through [facility] .   Per test claim: PA required; PA started via CoverMyMeds. KEY [id number] . Waiting for clinical questions to populate.

## 2023-10-07 NOTE — Patient Instructions (Addendum)
 MIGRAINE WITH AURA  MIGRAINE TREATMENT PLAN:  MIGRAINE PREVENTION  LIFESTYLE CHANGES -Stop or avoid smoking -Decrease or avoid caffeine / alcohol -Eat and sleep on a regular schedule -Exercise several times per week - start fremanezumab (Ajovy) 225mg  monthly  MIGRAINE RESCUE  - ibuprofen , tylenol  as needed - start rimegepant (Nurtec) 75mg  as needed for breakthrough headache; max 8 per month

## 2023-10-08 ENCOUNTER — Telehealth: Payer: Self-pay

## 2023-10-08 ENCOUNTER — Other Ambulatory Visit (HOSPITAL_COMMUNITY): Payer: Self-pay

## 2023-10-08 NOTE — Telephone Encounter (Signed)
 Pharmacy Patient Advocate Encounter   Received notification from CoverMyMeds that prior authorization for AJOVY (fremanezumab-vfrm) injection 225MG /1.5ML auto-injectors is required/requested.   Insurance verification completed.   The patient is insured through [facility] .   Per test claim: PA required; PA submitted to above mentioned insurance via CoverMyMeds Key/confirmation #/EOC [id number] Status is pending

## 2023-10-08 NOTE — Telephone Encounter (Signed)
 Clinical questions have been answered and PA submitted. PA currently Pending. Please be advised that most companies allow up to 30 days to make a decision. We will advise when a determination has been made, or follow up in 1 week.   Please reach out to our team, Rx Prior Auth Pool, if you haven't heard back in a week.

## 2023-10-11 ENCOUNTER — Other Ambulatory Visit (HOSPITAL_COMMUNITY): Payer: Self-pay

## 2023-10-11 NOTE — Telephone Encounter (Signed)
 Pharmacy Patient Advocate Encounter  Received notification from [facility] that Prior Authorization for Nurtec 75MG  dispersible tablets has been APPROVED from 10/10/2023 to 04/07/2024. Ran test claim, Copay is $0. This test claim was processed through [facility]- copay amounts may vary at other pharmacies due to pharmacy/plan contracts, or as the patient moves through the different stages of their insurance plan.   PA #/Case ID/Reference #: PA Case ID #: [id number]

## 2023-10-15 ENCOUNTER — Other Ambulatory Visit (HOSPITAL_COMMUNITY): Payer: Self-pay

## 2023-10-15 NOTE — Telephone Encounter (Signed)
 Pharmacy Patient Advocate Encounter  Received notification from [facility] that Prior Authorization for AJOVY  (fremanezumab -vfrm) injection 225MG /1.5ML auto-injectors has been APPROVED from 10/12/2023 to 04/09/2024. Ran test claim, Copay is $24.98. This test claim was processed through [facility]- copay amounts may vary at other pharmacies due to pharmacy/plan contracts, or as the patient moves through the different stages of their insurance plan.   PA #/Case ID/Reference #: PA Case ID #: [id number]

## 2023-10-18 ENCOUNTER — Encounter: Payer: Self-pay | Admitting: Diagnostic Neuroimaging

## 2023-10-19 ENCOUNTER — Other Ambulatory Visit (HOSPITAL_COMMUNITY): Payer: Self-pay

## 2023-10-19 ENCOUNTER — Telehealth: Payer: Self-pay

## 2023-10-19 NOTE — Telephone Encounter (Signed)
 Pharmacy Patient Advocate Encounter   Received notification from Patient Advice Request messages that prior authorization for Nurtec is required/requested.   Insurance verification completed.   The patient is insured through [facility] .   Per test claim: The current 30 day co-pay is, $0.  No PA needed at this time. This test claim was processed through [facility]- copay amounts may vary at other pharmacies due to pharmacy/plan contracts, or as the patient moves through the different stages of their insurance plan.

## 2023-10-22 ENCOUNTER — Other Ambulatory Visit (HOSPITAL_COMMUNITY): Payer: Self-pay

## 2023-11-17 ENCOUNTER — Telehealth: Payer: Self-pay | Admitting: Neurosurgery

## 2023-11-17 NOTE — Telephone Encounter (Signed)
 Patient called our office to request a second opinion for her cervical spine. She has most recently been treated at [facility] and has had recent imaging. Patient will obtain records and imaging and have them sent to our office.

## 2023-12-10 NOTE — Telephone Encounter (Signed)
 Records received from [facility], MRI in chart, recent injections. Schedule with MD for second opinion.  Left patient a message to call our office back.

## 2023-12-14 ENCOUNTER — Telehealth: Payer: Self-pay

## 2023-12-14 NOTE — Telephone Encounter (Signed)
 Spoke to [place], explained we havent seen the patient in over year so we can not do orders.

## 2023-12-14 NOTE — Telephone Encounter (Signed)
 Typically they can send me an order to cosign in the chart however she has not been seen since January 2024 and I will not be signing any orders for her

## 2023-12-14 NOTE — Telephone Encounter (Signed)
 Copied from CRM [id number]. Topic: Referral - Question >> Dec 14, 2023 11:35 AM Wess RAMAN wrote: Reason for CRM: Princess from [facility] would like to speak with the referral coordinator and request a diagnostic ultrasound and mammogram for the right breast. Patient's appt is scheduled for 12/14/23  Phone #: [phone] Fax #: [phone]

## 2023-12-14 NOTE — Telephone Encounter (Signed)
 See note below

## 2023-12-23 NOTE — Progress Notes (Signed)
 This patient's chart has been reviewed by a Care Connections Specialist.   Attempted to contact patient in order to discuss appointments and health maintenance screenings due for the upcoming year.   UTR (No voicemail / No phone number / Invalid phone number). and Left message with Care Connection Specialist's contact information.SABRA

## 2023-12-28 ENCOUNTER — Other Ambulatory Visit: Payer: Self-pay | Admitting: Family Medicine

## 2023-12-28 ENCOUNTER — Inpatient Hospital Stay
Admission: RE | Admit: 2023-12-28 | Discharge: 2023-12-28 | Disposition: A | Discharge status: Home / Self Care | Payer: Self-pay | Source: Ambulatory Visit | Attending: Neurosurgery | Admitting: Neurosurgery

## 2023-12-28 DIAGNOSIS — Z049 Encounter for examination and observation for unspecified reason: Secondary | ICD-10-CM

## 2023-12-28 NOTE — Progress Notes (Deleted)
 Referring Physician:  Antonette Rios ORN, NP [address] [place],  [place] [address]  Primary Physician:  Jean Rios ORN, NP  History of Present Illness: 12/28/2023 Jean Rios is here today with a chief complaint of ***  neck pain traveling into the bilateral arm  numbness, tingling and weakness in her arms   EMG bilateral upper extremities 01/20/2022 by Dr. Lane: Chronic mild left carpal tunnel syndrome  EMG bilateral lower extremities 03/16/2022 done by Dr. Lane: Very mild, chronic sensory polyneuropathy  EMG bilateral upper extremities done by Dr. Riccardo 04/16/2023   Duration: *** Location: *** Quality: *** Severity: ***  Precipitating: aggravated by *** Modifying factors: made better by *** Weakness: none Timing: *** Bowel/Bladder Dysfunction: bladder incontinence, chronic urinary urgency  Conservative measures:  Physical therapy: *** has participated in with [facility] 01/2023-02/2023 not in 2025? Multimodal medical therapy including regular antiinflammatories: *** Ibuprofen , Celebrex , Flexeril , Lyrica , Voltaren Injections:  -s/p C7-T1 IL ESI 08/11/2022 with temporary relief  -s/p bilateral L4-5 TFESI 06/04/2023 with initially nearly 100% relief but then pain is started returning -s/p bilateral L4-5 TFESI 07/02/2023 with 65% relief  -s/p bilateral L3, L4 medial branch and dorsal rami blocks 08/06/2023 with no significant relief   Past Surgery: ***no spine or neck surgery  Jean Rios has ***no symptoms of cervical myelopathy.  The symptoms are causing a significant impact on the patient's life.   I have utilized the care everywhere function in epic to review the outside records available from external health systems.   Review of Systems:  A 10 point review of systems is negative, except for the pertinent positives and negatives detailed in the HPI.  Past Medical History: Past Medical History:  Diagnosis Date   Anemia    Anxiety    Chronic  back pain    Depression    Fibromyalgia    Frequent headaches    Hyperlipidemia    Kidney stone    Nerve damage    POTS (postural orthostatic tachycardia syndrome)    Stroke [facility])     Past Surgical History: Past Surgical History:  Procedure Laterality Date   COLONOSCOPY WITH PROPOFOL  N/A 07/10/2021   Procedure: COLONOSCOPY WITH PROPOFOL ;  Surgeon: Jean Bi, MD;  Location: [facility] ENDOSCOPY;  Service: Gastroenterology;  Laterality: N/A;   ESOPHAGOGASTRODUODENOSCOPY N/A 07/10/2021   Procedure: ESOPHAGOGASTRODUODENOSCOPY (EGD);  Surgeon: Jean Bi, MD;  Location: [facility] ENDOSCOPY;  Service: Gastroenterology;  Laterality: N/A;   PARTIAL HYSTERECTOMY  2014   TENDON RELEASE  07/2022    Allergies: Allergies as of 12/30/2023 - Review Complete 10/07/2023  Allergen Reaction Noted   Fentanyl Other (See Comments), Shortness Of Breath, and Nausea And Vomiting 12/13/2021   Dust mite extract Hives and Itching 06/05/2021   Mixed feathers Itching 06/05/2021    Medications:  Current Outpatient Medications:    aspirin  EC 81 MG tablet, Take 1 tablet (81 mg total) by mouth daily. Swallow whole., Disp: 30 tablet, Rfl: 12   celecoxib  (CELEBREX ) 200 MG capsule, Take 200 mg by mouth 2 (two) times daily., Disp: , Rfl:    Cholecalciferol (VITAMIN D -3) 125 MCG (5000 UT) TABS, Take 1 capsule by mouth daily., Disp: , Rfl:    cyclobenzaprine  (FLEXERIL ) 10 MG tablet, TAKE 1 TABLET BY MOUTH THREE TIMES A DAY AS NEEDED FOR MUSCLE SPASM, Disp: 90 tablet, Rfl: 0   diclofenac Sodium (VOLTAREN ARTHRITIS PAIN) 1 % GEL, Apply topically as needed., Disp: , Rfl:    FLUoxetine (PROZAC) 40 MG capsule, Take  40 mg by mouth daily., Disp: , Rfl:    Fremanezumab -vfrm (AJOVY ) 225 MG/1.5ML SOAJ, Inject 225 mg into the skin every 30 (thirty) days., Disp: 1.68 mL, Rfl: 6   levocetirizine (XYZAL ) 5 MG tablet, Take 5 mg by mouth every evening., Disp: , Rfl:    mometasone (ELOCON) 0.1 % cream, Apply 1 Application topically at  bedtime as needed (break through hives)., Disp: , Rfl:    Multiple Vitamins-Minerals (ONE-A-DAY WOMENS PO), Take 1 tablet by mouth daily., Disp: , Rfl:    pregabalin  (LYRICA ) 100 MG capsule, Take 100 mg by mouth 2 (two) times daily., Disp: , Rfl:    Rimegepant Sulfate (NURTEC) 75 MG TBDP, Take 1 tablet (75 mg total) by mouth daily as needed., Disp: 8 tablet, Rfl: 6   rosuvastatin  (CRESTOR ) 20 MG tablet, TAKE 1 TABLET BY MOUTH EVERY DAY, Disp: 90 tablet, Rfl: 0  Social History: Social History   Tobacco Use   Smoking status: Former    Types: Cigarettes   Smokeless tobacco: Former    Types: Snuff  Substance Use Topics   Alcohol use: Yes    Alcohol/week: 3.0 standard drinks of alcohol    Types: 3 Standard drinks or equivalent per week   Drug use: Not Currently    Family Medical History: Family History  Problem Relation Age of Onset   Diabetes Mellitus II Maternal Grandfather     Physical Examination: There were no vitals filed for this visit.  General: Patient is in no apparent distress. Attention to examination is appropriate.  Neck:   Supple.  Full range of motion.  Respiratory: Patient is breathing without any difficulty.   NEUROLOGICAL:     Awake, alert, oriented to person, place, and time.  Speech is clear and fluent.   Cranial Nerves: Pupils equal round and reactive to light.  Facial tone is symmetric.  Facial sensation is symmetric. Shoulder shrug is symmetric. Tongue protrusion is midline.  There is no pronator drift.  Strength: Side Biceps Triceps Deltoid Interossei Grip Wrist Ext. Wrist Flex.  R 5 5 5 5 5 5 5   L 5 5 5 5 5 5 5    Side Iliopsoas Quads Hamstring PF DF EHL  R 5 5 5 5 5 5   L 5 5 5 5 5 5    Reflexes are ***2+ and symmetric at the biceps, triceps, brachioradialis, patella and achilles.   Hoffman's is absent.   Bilateral upper and lower extremity sensation is intact to light touch.    No evidence of dysmetria noted.  Gait is normal.     Medical  Decision Making  Imaging: ***  I have personally reviewed the images and agree with the above interpretation.  Assessment and Plan: Jean Rios is a pleasant 48 y.o. female with ***      Thank you for involving me in the care of this patient.      Chester K. Clois MD, [facility] Neurosurgery

## 2023-12-30 ENCOUNTER — Ambulatory Visit: Admitting: Neurosurgery

## 2024-01-12 ENCOUNTER — Ambulatory Visit
Admission: RE | Admit: 2024-01-12 | Discharge: 2024-01-12 | Disposition: A | Discharge status: Home / Self Care | Attending: Neurosurgery | Admitting: Neurosurgery

## 2024-01-12 ENCOUNTER — Ambulatory Visit: Admitting: Neurosurgery

## 2024-01-12 ENCOUNTER — Ambulatory Visit
Admission: RE | Admit: 2024-01-12 | Discharge: 2024-01-12 | Disposition: A | Discharge status: Home / Self Care | Source: Ambulatory Visit | Attending: Neurosurgery | Admitting: Neurosurgery

## 2024-01-12 VITALS — BP 142/86 | Ht 65.0 in | Wt 168.8 lb

## 2024-01-12 DIAGNOSIS — M4802 Spinal stenosis, cervical region: Secondary | ICD-10-CM | POA: Diagnosis not present

## 2024-01-12 DIAGNOSIS — M5010 Cervical disc disorder with radiculopathy, unspecified cervical region: Secondary | ICD-10-CM | POA: Diagnosis not present

## 2024-01-12 DIAGNOSIS — M47812 Spondylosis without myelopathy or radiculopathy, cervical region: Secondary | ICD-10-CM | POA: Insufficient documentation

## 2024-01-12 DIAGNOSIS — M5412 Radiculopathy, cervical region: Secondary | ICD-10-CM
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
## 2024-01-20 ENCOUNTER — Ambulatory Visit: Payer: Self-pay | Admitting: Neurosurgery

## 2024-01-21 ENCOUNTER — Telehealth: Payer: Self-pay

## 2024-01-21 ENCOUNTER — Ambulatory Visit: Payer: Self-pay | Admitting: Neurosurgery

## 2024-01-21 ENCOUNTER — Other Ambulatory Visit: Payer: Self-pay

## 2024-01-21 DIAGNOSIS — M5412 Radiculopathy, cervical region: Secondary | ICD-10-CM

## 2024-01-21 DIAGNOSIS — M47812 Spondylosis without myelopathy or radiculopathy, cervical region: Secondary | ICD-10-CM

## 2024-01-21 DIAGNOSIS — Z01818 Encounter for other preprocedural examination: Secondary | ICD-10-CM

## 2024-01-21 NOTE — Telephone Encounter (Signed)
 Planned surgery: Anterior Cervical Disk Arthroplasty, C5-7    Surgery date: 02/22/2024 at [facility]: [address], [place], [place] [address]) - you will find out your arrival time the business day before your surgery.   Pre-op appointment at [facility] Pre-admit Testing: you will receive a call with a date/time for this appointment. If you are scheduled for an in person appointment, Pre-admit Testing is located on the first floor of the [facility] building, [address], [address]. During this appointment, they will advise you which medications you can take the morning of surgery, and which medications you will need to hold for surgery. Labs (such as blood work, EKG) may be done at your pre-op appointment. You are not required to fast for these labs. Should you need to change your pre-op appointment, please call Pre-admit testing at [phone].     Blood thinners:    Aspirin  81mg :  OK to stay on aspirin  81mg      Surgical clearance: we will send a clearance form to Jodean Milo, MD. They may wish to see you in their office prior to signing the clearance form. If so, they may call you to schedule an appointment.     Common restrictions after spine surgery: No bending, lifting, or twisting ("BLT"). Avoid lifting objects heavier than 10 pounds for the first 6 weeks after surgery. Where possible, avoid household activities that involve lifting, bending, reaching, pushing, or pulling such as laundry, vacuuming, grocery shopping, and childcare. Try to arrange for help from friends and family for these activities while you heal. Do not drive while taking prescription pain medication. Weeks 6 through 12 after surgery: avoid lifting more than 25 pounds.    X-rays after surgery: Because you are having an arthroplasty: for appointments after your 2 week follow-up: please arrive at the [facility] outpatient imaging center ([address], [place]) or [facility] one hour prior to your appointment for x-rays. This applies to every appointment after your 2 week follow-up. Failure to do so may result in your appointment being rescheduled. *We recently started construction to have x-ray in our office. This may be completed by the time you come in for your 6 week post-op appointment. Please check with us  closer to that time to see if you can have your x-rays at our office*    How to contact us :  If you have any questions/concerns before or after surgery, you can reach us  at [phone], or you can send a mychart message. We can be reached by phone or mychart 8am-4pm, Monday-Friday.  *Please note: Calls after 4pm are forwarded to a third party answering service. Mychart messages are not routinely monitored during evenings, weekends, and holidays. Please call our office to contact the answering service for urgent concerns during non-business hours.   If you have FMLA/disability paperwork, please drop it off or fax it to [phone]   Appointments/FMLA & disability paperwork: [employer] Registered Nurse/Surgery scheduler: Aryanah Enslow, RN Certified Medical Assistants: Don, CMA, Elenor, CMA, & Damien, CMA Physician Assistants: Lyle Decamp, PA-C, Edsel Goods, PA-C & Glade Boys, PA-C Surgeons: Penne Sharps, MD & Reeves Daisy, MD   [facility] PREADMIT TESTING VISIT and SURGERY INFORMATION SHEET   Now that surgery has been scheduled you can anticipate several phone calls from [facility] services. A pharmacy technician will call you to verify your current list of medications taken at home.  The Pre-Service Center will call to verify your insurance information and to give you billing estimates and information.             The Preadmit Testing Office will be calling to schedule a visit to obtain information for the anesthesia team and provide  instructions on preparation for surgery.  What can you expect for the Preadmit Testing Visit: Appointments may be scheduled in-person or by telephone.  If a telephone visit is scheduled, you may be asked to come into the office to have lab tests or other studies performed.   This visit will not be completed any greater than 14 days prior to your surgery.  If your surgery has been scheduled for a future date, please do not be alarmed if we have not contacted you to schedule an appointment more than a month prior to the surgery date.    Please be prepared to provide the following information during this appointment:            -Personal medical history                                               -Medication and allergy list            -Any history of problems with anesthesia              -Recent lab work or diagnostic studies            -Please notify us  of any needs we should be aware of to provide the best care possible           -You will be provided with instructions on how to prepare for your surgery.    On The Day of Surgery:  You must have a driver to take you home after surgery, you will be asked not to drive for 24 hours following surgery.  Taxi, Gisele and non-medical transport will not be acceptable means of transportation unless you have a responsible individual who will be traveling with you.  Visitors in the surgical area:   2 people will be able to visit you in your room once your preparation for surgery has been completed. During surgery, your visitors will be asked to wait in the Surgery Waiting Area.  It is not a requirement for them to stay, if they prefer to leave and come back.  Your visitor(s) will be given an update once the surgery has been completed.  No visitors are allowed in the initial recovery room to respect patient privacy and safety.  Once you are more awake and transfer to the secondary recovery area, or are transferred to an inpatient room, visitors will again  be able to see you.  To respect and protect your privacy: We will ask on the day of surgery who your driver will be and what the contact number for that individual will be. We will ask if it is okay to share information with this individual, or if there is an alternative individual that we, or the surgeon, should contact to provide updates and information. If family or friends come to the surgical information desk requesting information about you, who you have not listed with us , no information will be given.   It may be helpful to designate someone as the main contact who will be responsible for updating your other friends  and family.    PREADMIT TESTING OFFICE: [phone] SAME DAY SURGERY: [phone] We look forward to caring for you before and throughout the process of your surgery.

## 2024-02-04 NOTE — Telephone Encounter (Signed)
 PT notes have been received

## 2024-02-04 NOTE — Telephone Encounter (Signed)
 Please see below message from the answering service.     Media Information  Document Information  AMB Correspondence  NEUROSURGERY ANSWERING SERVICE CALL  02/04/2024 09:49  Attached To:  [facility]  Source Information  Default, Provider, MD

## 2024-02-14 ENCOUNTER — Inpatient Hospital Stay: Admission: RE | Admit: 2024-02-14 | Source: Ambulatory Visit

## 2024-02-15 ENCOUNTER — Other Ambulatory Visit: Payer: Self-pay

## 2024-02-15 ENCOUNTER — Encounter
Admission: RE | Admit: 2024-02-15 | Discharge: 2024-02-15 | Disposition: A | Discharge status: Home / Self Care | Source: Ambulatory Visit | Attending: Neurosurgery | Admitting: Neurosurgery

## 2024-02-15 VITALS — BP 119/70 | HR 75 | Resp 16 | Wt 171.3 lb

## 2024-02-15 DIAGNOSIS — Z01818 Encounter for other preprocedural examination: Secondary | ICD-10-CM | POA: Insufficient documentation

## 2024-02-15 DIAGNOSIS — Z01810 Encounter for preprocedural cardiovascular examination: Secondary | ICD-10-CM

## 2024-02-15 DIAGNOSIS — Z01812 Encounter for preprocedural laboratory examination: Secondary | ICD-10-CM

## 2024-02-15 DIAGNOSIS — M5412 Radiculopathy, cervical region: Secondary | ICD-10-CM

## 2024-02-15 DIAGNOSIS — Z8673 Personal history of transient ischemic attack (TIA), and cerebral infarction without residual deficits: Secondary | ICD-10-CM | POA: Insufficient documentation

## 2024-02-15 HISTORY — DX: Other complications of anesthesia, initial encounter: T88.59XA

## 2024-02-15 HISTORY — DX: Personal history of urinary calculi: Z87.442

## 2024-02-15 HISTORY — DX: Pneumonia, unspecified organism: J18.9

## 2024-02-15 HISTORY — DX: Gastro-esophageal reflux disease without esophagitis: K21.9

## 2024-02-15 HISTORY — DX: Dyspnea, unspecified: R06.00

## 2024-02-15 LAB — SURGICAL PCR SCREEN
MRSA, PCR: NEGATIVE
Staphylococcus aureus: NEGATIVE

## 2024-02-15 LAB — TYPE AND SCREEN
ABO/RH(D): O POS
Antibody Screen: NEGATIVE

## 2024-02-15 NOTE — Patient Instructions (Addendum)
 Your procedure is scheduled on: 02/22/24 - Tuesday Report to the Registration Desk on the 1st floor of the [facility]. To find out your arrival time, please call [phone] between 1PM - 3PM on: 02/21/24 - Monday If your arrival time is 6:00 am, do not arrive before that time as the [facility] entrance doors do not open until 6:00 am.  REMEMBER: Instructions that are not followed completely may result in serious medical risk, up to and including death; or upon the discretion of your surgeon and anesthesiologist your surgery may need to be rescheduled.  Do not eat food after midnight the night before surgery.  No gum chewing or hard candies.  You may however, drink CLEAR liquids up to 2 hours before you are scheduled to arrive for your surgery. Do not drink anything within 2 hours of your scheduled arrival time.  Clear liquids include: - water  - apple juice without pulp - gatorade (not RED colors) - black coffee or tea (Do NOT add milk or creamers to the coffee or tea) Do NOT drink anything that is not on this list.  Anti-inflammatories (NSAIDS) such as Advil , Aleve, Ibuprofen , Motrin , Naproxen, Naprosyn and Aspirin  based products such as Excedrin, Goody's Powder, BC Powder. You may take Tylenol  if needed for pain up until the day of surgery.  Stop ANY OVER THE COUNTER supplements until after surgery : Multiple Vitamin    ON THE DAY OF SURGERY ONLY TAKE THESE MEDICATIONS WITH SIPS OF WATER:  buPROPion  (WELLBUTRIN )  celecoxib  (CELEBREX )  FLUoxetine (PROZAC)  omeprazole  (PRILOSEC OTC)  pregabalin  (LYRICA )     No Alcohol for 24 hours before or after surgery.  No Smoking including e-cigarettes for 24 hours before surgery.  No chewable tobacco products for at least 6 hours before surgery.  No nicotine patches on the day of surgery.  Do not use any recreational drugs for at least a week (preferably 2 weeks) before your surgery.  Please be advised that the combination of  cocaine and anesthesia may have negative outcomes, up to and including death. If you test positive for cocaine, your surgery will be cancelled.  On the morning of surgery brush your teeth with toothpaste and water, you may rinse your mouth with mouthwash if you wish. Do not swallow any toothpaste or mouthwash.  Use CHG Soap or wipes as directed on instruction sheet.  Do not wear jewelry, make-up, hairpins, clips or nail polish.  For welded (permanent) jewelry: bracelets, anklets, waist bands, etc.  Please have this removed prior to surgery.  If it is not removed, there is a chance that hospital personnel will need to cut it off on the day of surgery.  Do not wear lotions, powders, or perfumes.   Do not shave body hair from the neck down 48 hours before surgery.  Contact lenses, hearing aids and dentures may not be worn into surgery.  Do not bring valuables to the hospital. [facility] is not responsible for any missing/lost belongings or valuables.   Notify your doctor if there is any change in your medical condition (cold, fever, infection).  Wear comfortable clothing (specific to your surgery type) to the hospital.  After surgery, you can help prevent lung complications by doing breathing exercises.  Take deep breaths and cough every 1-2 hours. Your doctor may order a device called an Incentive Spirometer to help you take deep breaths.  When coughing or sneezing, hold a pillow firmly against your incision with both hands. This is  called "splinting." Doing this helps protect your incision. It also decreases belly discomfort.  If you are being admitted to the hospital overnight, leave your suitcase in the car. After surgery it may be brought to your room.  In case of increased patient census, it may be necessary for you, the patient, to continue your postoperative care in the Same Day Surgery department.  If you are being discharged the day of surgery, you will not be allowed to  drive home. You will need a responsible individual to drive you home and stay with you for 24 hours after surgery.   If you are taking public transportation, you will need to have a responsible individual with you.  Please call the Pre-admissions Testing Dept. at [phone] if you have any questions about these instructions.  Surgery Visitation Policy:  Patients having surgery or a procedure may have two visitors.  Children under the age of 71 must have an adult with them who is not the patient.  Inpatient Visitation:    Visiting hours are 7 a.m. to 8 p.m. Up to four visitors are allowed at one time in a patient room. The visitors may rotate out with other people during the day.  One visitor age 48 or older may stay with the patient overnight and must be in the room by 8 p.m.   Merchandiser, retail to address health-related social needs:  [email or web address]   Pre-operative 4 CHG Bath Instructions   You can play a key role in reducing the risk of infection after surgery. Your skin needs to be as free of germs as possible. You can reduce the number of germs on your skin by washing with CHG (chlorhexidine gluconate) soap before surgery. CHG is an antiseptic soap that kills germs and continues to kill germs even after washing.   DO NOT use if you have an allergy to chlorhexidine/CHG or antibacterial soaps. If your skin becomes reddened or irritated, stop using the CHG and notify one of our RNs at [phone].   Please shower with the CHG soap starting 4 days before surgery using the following schedule: 10/17 - 10/20.    Please keep in mind the following:  DO NOT shave, including legs and underarms, starting the day of your first shower.   You may shave your face at any point before/day of surgery.  Place clean sheets on your bed the day you start using CHG soap. Use a clean washcloth (not used since being washed) for each shower. DO NOT sleep with pets once  you start using the CHG.   CHG Shower Instructions:  If you choose to wash your hair and private area, wash first with your normal shampoo/soap.  After you use shampoo/soap, rinse your hair and body thoroughly to remove shampoo/soap residue.  Turn the water OFF and apply about 3 tablespoons (45 ml) of CHG soap to a CLEAN washcloth.  Apply CHG soap ONLY FROM YOUR NECK DOWN TO YOUR TOES (washing for 3-5 minutes)  DO NOT use CHG soap on face, private areas, open wounds, or sores.  Pay special attention to the area where your surgery is being performed.  If you are having back surgery, having someone wash your back for you may be helpful. Wait 2 minutes after CHG soap is applied, then you may rinse off the CHG soap.  Pat dry with a clean towel  Put on clean clothes/pajamas   If you choose to wear lotion, please use ONLY the CHG-compatible lotions on  the back of this paper.     Additional instructions for the day of surgery: DO NOT APPLY any lotions, deodorants, cologne, or perfumes.   Put on clean/comfortable clothes.  Brush your teeth.  Ask your nurse before applying any prescription medications to the skin.      CHG Compatible Lotions   Aveeno Moisturizing lotion  Cetaphil Moisturizing Cream  Cetaphil Moisturizing Lotion  Clairol Herbal Essence Moisturizing Lotion, Dry Skin  Clairol Herbal Essence Moisturizing Lotion, Extra Dry Skin  Clairol Herbal Essence Moisturizing Lotion, Normal Skin  Curel Age Defying Therapeutic Moisturizing Lotion with Alpha Hydroxy  Curel Extreme Care Body Lotion  Curel Soothing Hands Moisturizing Hand Lotion  Curel Therapeutic Moisturizing Cream, Fragrance-Free  Curel Therapeutic Moisturizing Lotion, Fragrance-Free  Curel Therapeutic Moisturizing Lotion, Original Formula  Eucerin Daily Replenishing Lotion  Eucerin Dry Skin Therapy Plus Alpha Hydroxy Crme  Eucerin Dry Skin Therapy Plus Alpha Hydroxy Lotion  Eucerin Original Crme  Eucerin Original  Lotion  Eucerin Plus Crme Eucerin Plus Lotion  Eucerin TriLipid Replenishing Lotion  Keri Anti-Bacterial Hand Lotion  Keri Deep Conditioning Original Lotion Dry Skin Formula Softly Scented  Keri Deep Conditioning Original Lotion, Fragrance Free Sensitive Skin Formula  Keri Lotion Fast Absorbing Fragrance Free Sensitive Skin Formula  Keri Lotion Fast Absorbing Softly Scented Dry Skin Formula  Keri Original Lotion  Keri Skin Renewal Lotion Keri Silky Smooth Lotion  Keri Silky Smooth Sensitive Skin Lotion  Nivea Body Creamy Conditioning Oil  Nivea Body Extra Enriched Lotion  Nivea Body Original Lotion  Nivea Body Sheer Moisturizing Lotion Nivea Crme  Nivea Skin Firming Lotion  NutraDerm 30 Skin Lotion  NutraDerm Skin Lotion  NutraDerm Therapeutic Skin Cream  NutraDerm Therapeutic Skin Lotion  ProShield Protective Hand Cream  Provon moisturizing lotion   How to Use an Incentive Spirometer  An incentive spirometer is a tool that measures how well you are filling your lungs with each breath. Learning to take long, deep breaths using this tool can help you keep your lungs clear and active. This may help to reverse or lessen your chance of developing breathing (pulmonary) problems, especially infection. You may be asked to use a spirometer: After a surgery. If you have a lung problem or a history of smoking. After a long period of time when you have been unable to move or be active. If the spirometer includes an indicator to show the highest number that you have reached, your health care provider or respiratory therapist will help you set a goal. Keep a log of your progress as told by your health care provider. What are the risks? Breathing too quickly may cause dizziness or cause you to pass out. Take your time so you do not get dizzy or light-headed. If you are in pain, you may need to take pain medicine before doing incentive spirometry. It is harder to take a deep breath if you are  having pain. How to use your incentive spirometer  Sit up on the edge of your bed or on a chair. Hold the incentive spirometer so that it is in an upright position. Before you use the spirometer, breathe out normally. Place the mouthpiece in your mouth. Make sure your lips are closed tightly around it. Breathe in slowly and as deeply as you can through your mouth, causing the piston or the ball to rise toward the top of the chamber. Hold your breath for 3-5 seconds, or for as long as possible. If the spirometer includes a coach indicator,  use this to guide you in breathing. Slow down your breathing if the indicator goes above the marked areas. Remove the mouthpiece from your mouth and breathe out normally. The piston or ball will return to the bottom of the chamber. Rest for a few seconds, then repeat the steps 10 or more times. Take your time and take a few normal breaths between deep breaths so that you do not get dizzy or light-headed. Do this every 1-2 hours when you are awake. If the spirometer includes a goal marker to show the highest number you have reached (best effort), use this as a goal to work toward during each repetition. After each set of 10 deep breaths, cough a few times. This will help to make sure that your lungs are clear. If you have an incision on your chest or abdomen from surgery, place a pillow or a rolled-up towel firmly against the incision when you cough. This can help to reduce pain while taking deep breaths and coughing. General tips When you are able to get out of bed: Walk around often. Continue to take deep breaths and cough in order to clear your lungs. Keep using the incentive spirometer until your health care provider says it is okay to stop using it. If you have been in the hospital, you may be told to keep using the spirometer at home. Contact a health care provider if: You are having difficulty using the spirometer. You have trouble using the spirometer  as often as instructed. Your pain medicine is not giving enough relief for you to use the spirometer as told. You have a fever. Get help right away if: You develop shortness of breath. You develop a cough with bloody mucus from the lungs. You have fluid or blood coming from an incision site after you cough. Summary An incentive spirometer is a tool that can help you learn to take long, deep breaths to keep your lungs clear and active. You may be asked to use a spirometer after a surgery, if you have a lung problem or a history of smoking, or if you have been inactive for a long period of time. Use your incentive spirometer as instructed every 1-2 hours while you are awake. If you have an incision on your chest or abdomen, place a pillow or a rolled-up towel firmly against your incision when you cough. This will help to reduce pain. Get help right away if you have shortness of breath, you cough up bloody mucus, or blood comes from your incision when you cough. This information is not intended to replace advice given to you by your health care provider. Make sure you discuss any questions you have with your health care provider. Document Revised: 07/10/2019 Document Reviewed: 07/10/2019 Elsevier Patient Education  2023 [employer].

## 2024-02-22 ENCOUNTER — Other Ambulatory Visit: Payer: Self-pay

## 2024-02-22 ENCOUNTER — Ambulatory Visit

## 2024-02-22 ENCOUNTER — Ambulatory Visit: Admitting: Registered Nurse

## 2024-02-22 ENCOUNTER — Encounter: Payer: Self-pay | Admitting: Neurosurgery

## 2024-02-22 ENCOUNTER — Observation Stay
Admission: RE | Admit: 2024-02-22 | Discharge: 2024-02-23 | Disposition: A | Discharge status: Home / Self Care | Attending: Neurosurgery | Admitting: Neurosurgery

## 2024-02-22 ENCOUNTER — Ambulatory Visit: Payer: Self-pay | Admitting: Urgent Care

## 2024-02-22 ENCOUNTER — Encounter
Admission: RE | Disposition: A | Discharge status: Home / Self Care | Payer: Self-pay | Source: Home / Self Care | Attending: Neurosurgery

## 2024-02-22 DIAGNOSIS — M542 Cervicalgia: Secondary | ICD-10-CM | POA: Diagnosis present

## 2024-02-22 DIAGNOSIS — M50123 Cervical disc disorder at C6-C7 level with radiculopathy: Secondary | ICD-10-CM | POA: Insufficient documentation

## 2024-02-22 DIAGNOSIS — M4712 Other spondylosis with myelopathy, cervical region: Secondary | ICD-10-CM | POA: Diagnosis not present

## 2024-02-22 DIAGNOSIS — Z87891 Personal history of nicotine dependence: Secondary | ICD-10-CM | POA: Insufficient documentation

## 2024-02-22 DIAGNOSIS — M47812 Spondylosis without myelopathy or radiculopathy, cervical region: Secondary | ICD-10-CM | POA: Diagnosis present

## 2024-02-22 DIAGNOSIS — M4722 Other spondylosis with radiculopathy, cervical region: Secondary | ICD-10-CM | POA: Diagnosis not present

## 2024-02-22 DIAGNOSIS — Z8673 Personal history of transient ischemic attack (TIA), and cerebral infarction without residual deficits: Secondary | ICD-10-CM | POA: Diagnosis not present

## 2024-02-22 DIAGNOSIS — Z7982 Long term (current) use of aspirin: Secondary | ICD-10-CM | POA: Diagnosis not present

## 2024-02-22 DIAGNOSIS — M50122 Cervical disc disorder at C5-C6 level with radiculopathy: Secondary | ICD-10-CM | POA: Diagnosis not present

## 2024-02-22 DIAGNOSIS — M5412 Radiculopathy, cervical region: Principal | ICD-10-CM | POA: Diagnosis present

## 2024-02-22 DIAGNOSIS — Z01818 Encounter for other preprocedural examination: Secondary | ICD-10-CM

## 2024-02-22 HISTORY — PX: CERVICAL ANTERIOR DISC ARTHROPLASTY, 2 LEVEL: SHX7538

## 2024-02-22 LAB — ABO/RH: ABO/RH(D): O POS

## 2024-02-22 SURGERY — CERVICAL ANTERIOR DISC ARTHROPLASTY, 2 LEVEL
Anesthesia: General | Site: Spine Cervical

## 2024-02-22 MED ORDER — DOCUSATE SODIUM 100 MG PO CAPS
ORAL_CAPSULE | ORAL | Status: AC
  Filled 2024-02-22: qty 1

## 2024-02-22 MED ORDER — CHLORHEXIDINE GLUCONATE 0.12 % MT SOLN
OROMUCOSAL | Status: AC
  Filled 2024-02-22: qty 15

## 2024-02-22 MED ORDER — FLUOXETINE HCL 20 MG PO CAPS
ORAL_CAPSULE | ORAL | Status: AC
  Filled 2024-02-22: qty 2

## 2024-02-22 MED ORDER — HYDROMORPHONE HCL 1 MG/ML IJ SOLN
INTRAMUSCULAR | Status: DC | PRN
  Administered 2024-02-22: 1 mg via INTRAVENOUS

## 2024-02-22 MED ORDER — PHENYLEPHRINE 80 MCG/ML (10ML) SYRINGE FOR IV PUSH (FOR BLOOD PRESSURE SUPPORT)
PREFILLED_SYRINGE | INTRAVENOUS | Status: DC | PRN
  Administered 2024-02-22: 80 ug via INTRAVENOUS

## 2024-02-22 MED ORDER — SODIUM CHLORIDE 0.9% FLUSH: 3.0000 mL | INTRAVENOUS | Status: DC | PRN

## 2024-02-22 MED ORDER — REMIFENTANIL HCL 1 MG IV SOLR
INTRAVENOUS | Status: AC
  Filled 2024-02-22: qty 1000

## 2024-02-22 MED ORDER — ACETAMINOPHEN 500 MG PO TABS
ORAL_TABLET | ORAL | Status: AC
  Filled 2024-02-22: qty 2

## 2024-02-22 MED ORDER — PHENYLEPHRINE 80 MCG/ML (10ML) SYRINGE FOR IV PUSH (FOR BLOOD PRESSURE SUPPORT)
PREFILLED_SYRINGE | INTRAVENOUS | Status: AC
  Filled 2024-02-22: qty 10

## 2024-02-22 MED ORDER — LACTATED RINGERS IV SOLN: INTRAVENOUS | Status: DC

## 2024-02-22 MED ORDER — HYDROMORPHONE HCL 1 MG/ML IJ SOLN
INTRAMUSCULAR | Status: AC
  Filled 2024-02-22: qty 1

## 2024-02-22 MED ORDER — ARIPIPRAZOLE 2 MG PO TABS
2.0000 mg | ORAL_TABLET | Freq: Every day | ORAL | Status: DC
  Administered 2024-02-22: 2 mg via ORAL
  Filled 2024-02-22: qty 1

## 2024-02-22 MED ORDER — SERTRALINE HCL 50 MG PO TABS
ORAL_TABLET | ORAL | Status: AC
  Filled 2024-02-22: qty 1

## 2024-02-22 MED ORDER — CHLORHEXIDINE GLUCONATE 0.12 % MT SOLN
15.0000 mL | Freq: Once | OROMUCOSAL | Status: AC
  Administered 2024-02-22: 15 mL via OROMUCOSAL

## 2024-02-22 MED ORDER — CETIRIZINE HCL 10 MG PO TABS
10.0000 mg | ORAL_TABLET | Freq: Every evening | ORAL | Status: DC
  Filled 2024-02-22 (×2): qty 1

## 2024-02-22 MED ORDER — SUGAMMADEX SODIUM 200 MG/2ML IV SOLN
INTRAVENOUS | Status: DC | PRN
  Administered 2024-02-22: 200 mg via INTRAVENOUS

## 2024-02-22 MED ORDER — BUPIVACAINE-EPINEPHRINE (PF) 0.5% -1:200000 IJ SOLN
INTRAMUSCULAR | Status: DC | PRN
  Administered 2024-02-22: 6 mL via PERINEURAL

## 2024-02-22 MED ORDER — KETOROLAC TROMETHAMINE 15 MG/ML IJ SOLN
INTRAMUSCULAR | Status: AC
Start: 2024-02-22 — End: 2024-02-22
  Filled 2024-02-22: qty 1

## 2024-02-22 MED ORDER — ACETAMINOPHEN 500 MG PO TABS
1000.0000 mg | ORAL_TABLET | Freq: Four times a day (QID) | ORAL | Status: DC
  Administered 2024-02-22 – 2024-02-23 (×3): 1000 mg via ORAL
  Filled 2024-02-22 (×2): qty 2

## 2024-02-22 MED ORDER — LIDOCAINE HCL (CARDIAC) PF 100 MG/5ML IV SOSY
PREFILLED_SYRINGE | INTRAVENOUS | Status: DC | PRN
  Administered 2024-02-22: 100 mg via INTRAVENOUS

## 2024-02-22 MED ORDER — MIDAZOLAM HCL 2 MG/2ML IJ SOLN
INTRAMUSCULAR | Status: AC
  Filled 2024-02-22: qty 2

## 2024-02-22 MED ORDER — OXYCODONE HCL 5 MG PO TABS
5.0000 mg | ORAL_TABLET | ORAL | Status: DC | PRN
  Administered 2024-02-22 – 2024-02-23 (×3): 5 mg via ORAL
  Filled 2024-02-22 (×3): qty 1

## 2024-02-22 MED ORDER — QUETIAPINE FUMARATE 25 MG PO TABS
50.0000 mg | ORAL_TABLET | Freq: Every day | ORAL | Status: DC
  Administered 2024-02-22: 50 mg via ORAL
  Filled 2024-02-22: qty 2

## 2024-02-22 MED ORDER — DEXMEDETOMIDINE HCL IN NACL 80 MCG/20ML IV SOLN
INTRAVENOUS | Status: DC | PRN
Start: 2024-02-22 — End: 2024-02-22
  Administered 2024-02-22: 4 ug via INTRAVENOUS

## 2024-02-22 MED ORDER — ROCURONIUM BROMIDE 10 MG/ML (PF) SYRINGE
PREFILLED_SYRINGE | INTRAVENOUS | Status: AC
  Filled 2024-02-22: qty 10

## 2024-02-22 MED ORDER — RIMEGEPANT SULFATE 75 MG PO TBDP: 75.0000 mg | ORAL_TABLET | Freq: Every day | ORAL | Status: DC | PRN

## 2024-02-22 MED ORDER — MIDAZOLAM HCL (PF) 2 MG/2ML IJ SOLN
INTRAMUSCULAR | Status: DC | PRN
  Administered 2024-02-22: 2 mg via INTRAVENOUS

## 2024-02-22 MED ORDER — 0.9 % SODIUM CHLORIDE (POUR BTL) OPTIME
TOPICAL | Status: DC | PRN
  Administered 2024-02-22: 500 mL

## 2024-02-22 MED ORDER — PROPOFOL 10 MG/ML IV BOLUS
INTRAVENOUS | Status: DC | PRN
  Administered 2024-02-22: 30 mg via INTRAVENOUS
  Administered 2024-02-22: 200 mg via INTRAVENOUS

## 2024-02-22 MED ORDER — BUPROPION HCL 100 MG PO TABS
100.0000 mg | ORAL_TABLET | Freq: Every day | ORAL | Status: DC
  Administered 2024-02-23: 100 mg via ORAL
  Filled 2024-02-22: qty 1

## 2024-02-22 MED ORDER — HYDROMORPHONE HCL 1 MG/ML IJ SOLN: 0.5000 mg | INTRAMUSCULAR | Status: AC | PRN

## 2024-02-22 MED ORDER — HYDROMORPHONE HCL 1 MG/ML IJ SOLN
0.2500 mg | INTRAMUSCULAR | Status: DC | PRN
  Administered 2024-02-22 (×2): 0.5 mg via INTRAVENOUS

## 2024-02-22 MED ORDER — CEFAZOLIN IN SODIUM CHLORIDE 2-0.9 GM/100ML-% IV SOLN
2.0000 g | Freq: Once | INTRAVENOUS | Status: AC
  Administered 2024-02-22: 2 g via INTRAVENOUS

## 2024-02-22 MED ORDER — ACETAMINOPHEN 650 MG RE SUPP: 650.0000 mg | RECTAL | Status: DC | PRN

## 2024-02-22 MED ORDER — PANTOPRAZOLE SODIUM 20 MG PO TBEC
20.0000 mg | DELAYED_RELEASE_TABLET | Freq: Every day | ORAL | Status: DC
  Administered 2024-02-23: 20 mg via ORAL
  Filled 2024-02-22 (×2): qty 1

## 2024-02-22 MED ORDER — MENTHOL 3 MG MT LOZG: 1.0000 | LOZENGE | OROMUCOSAL | Status: DC | PRN

## 2024-02-22 MED ORDER — ORAL CARE MOUTH RINSE: 15.0000 mL | Freq: Once | OROMUCOSAL | Status: AC

## 2024-02-22 MED ORDER — ENOXAPARIN SODIUM 40 MG/0.4ML IJ SOSY
40.0000 mg | PREFILLED_SYRINGE | INTRAMUSCULAR | Status: DC
  Administered 2024-02-23: 40 mg via SUBCUTANEOUS
  Filled 2024-02-22: qty 0.4

## 2024-02-22 MED ORDER — SURGIFLO WITH THROMBIN (HEMOSTATIC MATRIX KIT) OPTIME
TOPICAL | Status: DC | PRN
  Administered 2024-02-22: 1 via TOPICAL

## 2024-02-22 MED ORDER — FENTANYL CITRATE (PF) 100 MCG/2ML IJ SOLN
INTRAMUSCULAR | Status: AC
  Filled 2024-02-22: qty 2

## 2024-02-22 MED ORDER — ONDANSETRON HCL 4 MG/2ML IJ SOLN
INTRAMUSCULAR | Status: DC | PRN
  Administered 2024-02-22: 4 mg via INTRAVENOUS

## 2024-02-22 MED ORDER — OXYCODONE HCL 5 MG PO TABS
ORAL_TABLET | ORAL | Status: AC
  Filled 2024-02-22: qty 1

## 2024-02-22 MED ORDER — PROPOFOL 500 MG/50ML IV EMUL
INTRAVENOUS | Status: DC | PRN
  Administered 2024-02-22: 125 ug/kg/min via INTRAVENOUS

## 2024-02-22 MED ORDER — PHENOL 1.4 % MT LIQD: 1.0000 | OROMUCOSAL | Status: DC | PRN

## 2024-02-22 MED ORDER — PROPOFOL 1000 MG/100ML IV EMUL
INTRAVENOUS | Status: AC
  Filled 2024-02-22: qty 100

## 2024-02-22 MED ORDER — FLUOXETINE HCL 20 MG PO CAPS
40.0000 mg | ORAL_CAPSULE | Freq: Two times a day (BID) | ORAL | Status: DC
  Administered 2024-02-22 – 2024-02-23 (×2): 40 mg via ORAL
  Filled 2024-02-22: qty 2

## 2024-02-22 MED ORDER — SODIUM CHLORIDE 0.9% FLUSH
3.0000 mL | Freq: Two times a day (BID) | INTRAVENOUS | Status: DC
  Administered 2024-02-22: 3 mL via INTRAVENOUS

## 2024-02-22 MED ORDER — ONDANSETRON HCL 4 MG PO TABS: 4.0000 mg | ORAL_TABLET | Freq: Four times a day (QID) | ORAL | Status: DC | PRN

## 2024-02-22 MED ORDER — REMIFENTANIL HCL 1 MG IV SOLR
INTRAVENOUS | Status: DC | PRN
  Administered 2024-02-22: .07 ug/kg/min via INTRAVENOUS

## 2024-02-22 MED ORDER — DOCUSATE SODIUM 100 MG PO CAPS
100.0000 mg | ORAL_CAPSULE | Freq: Two times a day (BID) | ORAL | Status: DC
  Administered 2024-02-22 – 2024-02-23 (×2): 100 mg via ORAL
  Filled 2024-02-22: qty 1

## 2024-02-22 MED ORDER — SODIUM CHLORIDE 0.9 % IV SOLN: 250.0000 mL | INTRAVENOUS | Status: DC

## 2024-02-22 MED ORDER — SORBITOL 70 % SOLN: 30.0000 mL | Freq: Every day | Status: DC | PRN

## 2024-02-22 MED ORDER — ONDANSETRON HCL 4 MG/2ML IJ SOLN
INTRAMUSCULAR | Status: AC
  Filled 2024-02-22: qty 2

## 2024-02-22 MED ORDER — POLYETHYLENE GLYCOL 3350 17 G PO PACK: 17.0000 g | PACK | Freq: Every day | ORAL | Status: DC | PRN

## 2024-02-22 MED ORDER — OXYCODONE HCL 5 MG/5ML PO SOLN: 5.0000 mg | Freq: Once | ORAL | Status: AC | PRN

## 2024-02-22 MED ORDER — SENNA 8.6 MG PO TABS
1.0000 | ORAL_TABLET | Freq: Two times a day (BID) | ORAL | Status: DC
  Administered 2024-02-22 – 2024-02-23 (×2): 8.6 mg via ORAL
  Filled 2024-02-22: qty 1

## 2024-02-22 MED ORDER — PROPOFOL 10 MG/ML IV BOLUS
INTRAVENOUS | Status: AC
  Filled 2024-02-22: qty 20

## 2024-02-22 MED ORDER — ROCURONIUM BROMIDE 100 MG/10ML IV SOLN
INTRAVENOUS | Status: DC | PRN
  Administered 2024-02-22: 50 mg via INTRAVENOUS

## 2024-02-22 MED ORDER — OXYCODONE HCL 5 MG PO TABS
10.0000 mg | ORAL_TABLET | ORAL | Status: DC | PRN
  Administered 2024-02-23: 10 mg via ORAL
  Filled 2024-02-22: qty 2

## 2024-02-22 MED ORDER — ROSUVASTATIN CALCIUM 20 MG PO TABS
ORAL_TABLET | ORAL | Status: AC
  Filled 2024-02-22: qty 1

## 2024-02-22 MED ORDER — ROSUVASTATIN CALCIUM 10 MG PO TABS
20.0000 mg | ORAL_TABLET | Freq: Every day | ORAL | Status: DC
  Administered 2024-02-22: 20 mg via ORAL

## 2024-02-22 MED ORDER — MAGNESIUM CITRATE PO SOLN: 1.0000 | Freq: Once | ORAL | Status: DC | PRN

## 2024-02-22 MED ORDER — OXYCODONE HCL 5 MG PO TABS
5.0000 mg | ORAL_TABLET | Freq: Once | ORAL | Status: AC | PRN
  Administered 2024-02-22: 5 mg via ORAL

## 2024-02-22 MED ORDER — PREGABALIN 50 MG PO CAPS
100.0000 mg | ORAL_CAPSULE | Freq: Two times a day (BID) | ORAL | Status: DC
  Administered 2024-02-22 – 2024-02-23 (×2): 100 mg via ORAL
  Filled 2024-02-22 (×2): qty 2

## 2024-02-22 MED ORDER — ONDANSETRON HCL 4 MG/2ML IJ SOLN: 4.0000 mg | Freq: Four times a day (QID) | INTRAMUSCULAR | Status: DC | PRN

## 2024-02-22 MED ORDER — KETOROLAC TROMETHAMINE 15 MG/ML IJ SOLN
15.0000 mg | Freq: Four times a day (QID) | INTRAMUSCULAR | Status: DC
  Administered 2024-02-22 – 2024-02-23 (×3): 15 mg via INTRAVENOUS
  Filled 2024-02-22 (×2): qty 1

## 2024-02-22 MED ORDER — SENNA 8.6 MG PO TABS
ORAL_TABLET | ORAL | Status: AC
  Filled 2024-02-22: qty 1

## 2024-02-22 MED ORDER — CEFAZOLIN SODIUM-DEXTROSE 2-4 GM/100ML-% IV SOLN
INTRAVENOUS | Status: AC
  Filled 2024-02-22: qty 100

## 2024-02-22 MED ORDER — DEXAMETHASONE SOD PHOSPHATE PF 10 MG/ML IJ SOLN
INTRAMUSCULAR | Status: DC | PRN
  Administered 2024-02-22: 10 mg via INTRAVENOUS

## 2024-02-22 MED ORDER — ACETAMINOPHEN 325 MG PO TABS: 650.0000 mg | ORAL_TABLET | ORAL | Status: DC | PRN

## 2024-02-22 MED ORDER — CYCLOBENZAPRINE HCL 10 MG PO TABS
10.0000 mg | ORAL_TABLET | Freq: Three times a day (TID) | ORAL | Status: DC | PRN
  Administered 2024-02-22 – 2024-02-23 (×2): 10 mg via ORAL
  Filled 2024-02-22 (×2): qty 1

## 2024-02-22 SURGICAL SUPPLY — 30 items
BUR NEURO DRILL SOFT 3.0X3.8M (BURR) ×1 IMPLANT
DERMABOND ADVANCED .7 DNX12 (GAUZE/BANDAGES/DRESSINGS) ×1 IMPLANT
DISC MOBI-C CERVICAL 13X15 4.5 (Miscellaneous) IMPLANT
DRAPE C-ARM XRAY 36X54 (DRAPES) ×2 IMPLANT
DRAPE LAPAROTOMY 77X122 PED (DRAPES) ×1 IMPLANT
DRAPE SPINE LEICA/WILD 54X150 (DRAPES) ×1 IMPLANT
DRAPE SURG 17X11 SM STRL (DRAPES) ×1 IMPLANT
ELECTRODE CAUTERY BLDE TIP 2.5 (TIP) ×1 IMPLANT
ELECTRODE REM PT RTRN 9FT ADLT (ELECTROSURGICAL) ×1 IMPLANT
FEE INTRAOP CADWELL SUPPLY NCS (MISCELLANEOUS) IMPLANT
FEE INTRAOP MONITOR IMPULS NCS (MISCELLANEOUS) IMPLANT
GLOVE BIOGEL PI IND STRL 7.0 (GLOVE) ×1 IMPLANT
GLOVE BIOGEL PI IND STRL 8 (GLOVE) ×1 IMPLANT
GLOVE SURG SYN 7.0 PF PI (GLOVE) ×1 IMPLANT
GLOVE SURG SYN 7.5 PF PI (GLOVE) ×2 IMPLANT
GOWN SRG LRG LVL 4 IMPRV REINF (GOWNS) ×1 IMPLANT
GOWN SRG XL LVL 3 NONREINFORCE (GOWNS) ×1 IMPLANT
KIT TURNOVER KIT A (KITS) ×1 IMPLANT
MANIFOLD NEPTUNE II (INSTRUMENTS) ×1 IMPLANT
NS IRRIG 500ML POUR BTL (IV SOLUTION) ×1 IMPLANT
PACK LAMINECTOMY ARMC (PACKS) ×1 IMPLANT
PAD ARMBOARD POSITIONER FOAM (MISCELLANEOUS) ×1 IMPLANT
PIN CASPAR SPINAL 12MM (PIN) IMPLANT
SPONGE KITTNER 5P (MISCELLANEOUS) ×1 IMPLANT
SURGIFLO W/THROMBIN 8M KIT (HEMOSTASIS) ×1 IMPLANT
SUT STRATA 3-0 15 PS-2 (SUTURE) ×1 IMPLANT
SUT VICRYL 3-0 CR8 SH (SUTURE) ×1 IMPLANT
SYR 20ML LL LF (SYRINGE) ×1 IMPLANT
TAPE CLOTH 3X10 WHT NS LF (GAUZE/BANDAGES/DRESSINGS) ×2 IMPLANT
TRAP FLUID SMOKE EVACUATOR (MISCELLANEOUS) ×1 IMPLANT

## 2024-02-22 NOTE — Anesthesia Preprocedure Evaluation (Signed)
 Anesthesia Evaluation  Patient identified by MRN, date of birth, ID band Patient awake    Reviewed: Allergy & Precautions, NPO status , Patient's Chart, lab work & pertinent test results  History of Anesthesia Complications (+) PROLONGED EMERGENCE and history of anesthetic complications  Airway Mallampati: III  TM Distance: >3 FB Neck ROM: full    Dental  (+) Chipped   Pulmonary sleep apnea , former smoker   Pulmonary exam normal        Cardiovascular negative cardio ROS Normal cardiovascular exam     Neuro/Psych  Headaches PSYCHIATRIC DISORDERS Anxiety Depression     Neuromuscular disease CVA    GI/Hepatic Neg liver ROS,GERD  ,,  Endo/Other  negative endocrine ROS    Renal/GU      Musculoskeletal  (+)  Fibromyalgia -  Abdominal   Peds  Hematology  (+) Blood dyscrasia, anemia   Anesthesia Other Findings Past Medical History: No date: Anemia No date: Anxiety No date: Chronic back pain No date: Complication of anesthesia     Comment:  SLOW TO WAKE UP, CHIVERED REALLY BAD No date: Depression No date: Dyspnea No date: Fibromyalgia No date: Frequent headaches No date: GERD (gastroesophageal reflux disease) No date: History of kidney stones No date: Hyperlipidemia No date: Kidney stone No date: Nerve damage No date: Pneumonia No date: POTS (postural orthostatic tachycardia syndrome) No date: Stroke [facility])  Past Surgical History: 07/10/2021: COLONOSCOPY WITH PROPOFOL ; N/A     Comment:  Procedure: COLONOSCOPY WITH PROPOFOL ;  Surgeon: Therisa Bi, MD;  Location: [facility] ENDOSCOPY;  Service:               Gastroenterology;  Laterality: N/A; 1997: DIAGNOSTIC LAPAROSCOPY     Comment:  LASER ENDOMETRIUM AREAS 1997: DILATION AND CURETTAGE OF UTERUS 07/10/2021: ESOPHAGOGASTRODUODENOSCOPY; N/A     Comment:  Procedure: ESOPHAGOGASTRODUODENOSCOPY (EGD);  Surgeon:               Therisa Bi, MD;  Location:  [facility] ENDOSCOPY;  Service:               Gastroenterology;  Laterality: N/A; 2014: PARTIAL HYSTERECTOMY 07/2022: TENDON RELEASE No date: WISDOM TOOTH EXTRACTION     Comment:  AT AGE 74  BMI    Body Mass Index: 28.51 kg/m      Reproductive/Obstetrics negative OB ROS                              Anesthesia Physical Anesthesia Plan  ASA: 3  Anesthesia Plan: General ETT   Post-op Pain Management: Toradol  IV (intra-op)*, Ofirmev  IV (intra-op)* and Ketamine IV*   Induction: Intravenous  PONV Risk Score and Plan: 3 and Ondansetron , Dexamethasone , Midazolam and Treatment may vary due to age or medical condition  Airway Management Planned: Oral ETT  Additional Equipment:   Intra-op Plan:   Post-operative Plan: Extubation in OR  Informed Consent: I have reviewed the patients History and Physical, chart, labs and discussed the procedure including the risks, benefits and alternatives for the proposed anesthesia with the patient or authorized representative who has indicated his/her understanding and acceptance.     Dental Advisory Given  Plan Discussed with: Anesthesiologist, CRNA and Surgeon  Anesthesia Plan Comments: (Patient consented for risks of anesthesia including but not limited to:  - adverse reactions to medications - damage to eyes, teeth, lips or other oral mucosa -  nerve damage due to positioning  - sore throat or hoarseness - Damage to heart, brain, nerves, lungs, other parts of body or loss of life  Patient voiced understanding and assent.)         Anesthesia Quick Evaluation

## 2024-02-22 NOTE — Anesthesia Procedure Notes (Signed)
 Procedure Name: Intubation Date/Time: 02/22/2024 12:39 PM  Performed by: Elly Pfeiffer, CRNAPre-anesthesia Checklist: Patient identified, Emergency Drugs available, Suction available and Patient being monitored Patient Re-evaluated:Patient Re-evaluated prior to induction Oxygen Delivery Method: Circle system utilized Preoxygenation: Pre-oxygenation with 100% oxygen Induction Type: IV induction Ventilation: Mask ventilation without difficulty Laryngoscope Size: McGrath and 4 Tube type: Oral Tube size: 7.0 mm Number of attempts: 1 Airway Equipment and Method: Stylet and Oral airway Placement Confirmation: ETT inserted through vocal cords under direct vision, positive ETCO2 and breath sounds checked- equal and bilateral Secured at: 22 cm Tube secured with: Tape Dental Injury: Teeth and Oropharynx as per pre-operative assessment  Comments: Cords clear; no trauma. CA

## 2024-02-22 NOTE — Discharge Instructions (Signed)
 Your surgeon has performed an operation on your cervical spine (neck) to relieve pressure on the spinal cord and/or nerves. This involved making an incision in the front of your neck and removing one or more of the discs that support your spine. Next, a small piece of bone, a titanium plate, and screws were used to fuse two or more of the vertebrae (bones) together.  The following are instructions to help in your recovery once you have been discharged from the hospital. Even if you feel well, it is important that you follow these activity guidelines. If you do not let your neck heal properly from the surgery, you can increase the chance of return of your symptoms and other complications.  * It is ok to take anti-inflammatory medications  *Regarding compression stockings-  Please wear day and night until you are walking a couple hundred feet three times a day.   Activity    No bending, lifting, or twisting ("BLT"). Avoid lifting objects heavier than 10 pounds (gallon milk jug).  Where possible, avoid household activities that involve lifting, bending, reaching, pushing, or pulling such as laundry, vacuuming, grocery shopping, and childcare. Try to arrange for help from friends and family for these activities while your back heals.  Increase physical activity slowly as tolerated.  Taking short walks is encouraged, but avoid strenuous exercise. Do not jog, run, bicycle, lift weights, or participate in any other exercises unless specifically allowed by your doctor.  Talk to your doctor before resuming sexual activity.  You should not drive until cleared by your doctor.  Until released by your doctor, you should not return to work or school.  You should rest at home and let your body heal.   You may shower three days after your surgery.  After showering, lightly dab your incision dry. Do not take a tub bath or go swimming until approved by your doctor at your follow-up appointment.  If your doctor  ordered a cervical collar (neck brace) for you, you should wear it whenever you are out of bed. You may remove it when lying down or sleeping, but you should wear it at all other times. Not all neck surgeries require a cervical collar.  If you smoke, we strongly recommend that you quit.  Smoking has been proven to interfere with normal bone healing and will dramatically reduce the success rate of your surgery. Please contact [facility] (800-QUIT-NOW) and use the resources at www.[facility].com for assistance in stopping smoking.  Surgical Incision   If you have a dressing on your incision, you may remove it two days after your surgery. Keep your incision area clean and dry.  If you have staples or stitches on your incision, you should have a follow up scheduled for removal. If you do not have staples or stitches, you will have steri-strips (small pieces of surgical tape) or Dermabond glue. The steri-strips/glue should begin to peel away within about a week (it is fine if the steri-strips fall off before then). If the strips are still in place one week after your surgery, you may gently remove them.  Diet           You may return to your usual diet. However, you may experience discomfort when swallowing in the first month after your surgery. This is normal. You may find that softer foods are more comfortable for you to swallow. Be sure to stay hydrated.  When to Contact Us   You may experience pain in your neck and/or pain  between your shoulder blades. This is normal and should improve in the next few weeks with the help of pain medication, muscle relaxers, and rest. Some patients report that a warm compress on the back of the neck or between the shoulder blades helps.  However, should you experience any of the following, contact us  immediately: New numbness or weakness Pain that is progressively getting worse, and is not relieved by your pain medication, muscle relaxers, rest, and warm  compresses Bleeding, redness, swelling, pain, or drainage from surgical incision Chills or flu-like symptoms Fever greater than 101.0 F (38.3 C) Inability to eat, drink fluids, or take medications Problems with bowel or bladder functions Difficulty breathing or shortness of breath Warmth, tenderness, or swelling in your calf Contact Information How to contact us :  If you have any questions/concerns before or after surgery, you can reach us  at [phone], or you can send a mychart message. We can be reached by phone or mychart 8am-4pm, Monday-Friday.  *Please note: Calls after 4pm are forwarded to a third party answering service. Mychart messages are not routinely monitored during evenings, weekends, and holidays. Please call our office to contact the answering service for urgent concerns during non-business hours.

## 2024-02-22 NOTE — Transfer of Care (Signed)
 Immediate Anesthesia Transfer of Care Note  Patient: Jean Rios  Procedure(s) Performed: Anterior Cervical Disk Arthroplasty, C5-7 (Spine Cervical)  Patient Location: PACU  Anesthesia Type:General  Level of Consciousness: drowsy and patient cooperative  Airway & Oxygen Therapy: Patient Spontanous Breathing  Post-op Assessment: Report given to RN and Post -op Vital signs reviewed and stable  Post vital signs: stable  Last Vitals:  Vitals Value Taken Time  BP    Temp    Pulse    Resp 13 02/22/24 14:47  SpO2    Vitals shown include unfiled device data.  Last Pain:  Vitals:   02/22/24 1007  TempSrc: Temporal  PainSc: 0-No pain         Complications: No notable events documented.

## 2024-02-22 NOTE — Op Note (Signed)
 Indications: [name] is a 48 y.o. 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  Given her progressive symptomatology she was taken to the OR for a C5-C7 anterior cervical disc arthroplasty.  Findings: Broad-based disc bulge at C5-6.  Significantly compressive.  Well decompressed at the end of the level.  Left-sided paracentral large disc herniation causing compression of the cervical spinal cord at C6-7.  Well decompressed at the end of the decompression.  Preoperative Diagnosis: Cervical Spondylosis Causing Cervical Myeloradiculopathy  Postoperative Diagnosis: Cervical Spondylosis Causing Cervical Myeloradiculopathy   EBL: 5 mL IVF: See Anesthesia Report Drains: none Disposition: Extubated and Stable to PACU Complications: none  No foley catheter was placed.   Preoperative Note:   Risks of surgery discussed include: infection, bleeding, stroke, coma, death, paralysis, CSF leak, nerve/spinal cord injury, numbness, tingling, weakness, complex regional pain syndrome, recurrent stenosis and/or disc herniation, vascular injury, development of instability, neck/back pain, need for further surgery, persistent symptoms, development of deformity, and the risks of anesthesia. The patient understood these risks and agreed to proceed.  Operative Note:  Procedure:  1) Cervical Disc Arthroplasty at C5/7 using a LDR Mobi-C device   Procedure: After obtaining informed consent, the patient taken to the operating room, placed in supine position, general anesthesia induced.  The patient had a small shoulder roll placed behind the neck.  The patient received preop antibiotics and IV Decadron .  The patient had a neck incision outlined, was prepped and draped in usual sterile fashion. The incision was injected with local anesthetic.   An incision was opened,  dissection taken down medial to the carotid artery and jugular vein, lateral to the trachea and esophagus.  The prevertebral fascia identified and a localizing x-ray demonstrated the correct level.  The longus colli were dissected laterally, and self-retaining retractors placed to open the operative field. The microscope was then brought into the field.  With this complete, distractor pins were placed in the vertebral bodies of C 5 and C 6. The distractor was placed, and the annulus at C 5/6 was opened using a bovie.  Curettes and pituitary rongeurs used to remove the majority of disk, then the drill was used to remove the posterior osteophyte and begin the foraminotomies. The nerve hook was used to elevate the posterior longitudinal ligament, which was then removed with Kerrison rongeurs. The microblunt nerve hook could be passed out the foramen bilaterally.   Meticulous hemostasis was obtained.  A trial spacer was used to size the disc space. Using flouroscopic guidance, a 13 mm width x 15 mm depth x 4.106mm height Mobi-C was then inserted in the prepared disc space.  The caspar distractor was removed, and bone wax used for hemostasis. Final AP and lateral radiographs were taken for that level.   We then turned our attention to the C6-7 level.  A Caspar pin was placed at the C7 body.  The disc and annulus were incised and the disc was removed with a mixture of Kerrison rongeurs, curettes, and pituitary rongeur's.  We worked our way back to the posterior osteophytic complex.  This was taken down with a high-speed drill.  We were able to see the PLL and a large disc herniation going through the PLL compressing the spinal cord.  We are able to dissect into the epidural level and remove the posterior longitudinal ligament.  We continue to decompress until we reached  the foramen bilaterally.  Once we are able to reach this we used a nerve hook to evaluate for decompression which was good bilaterally.A trial spacer  was used to size the disc space. Using flouroscopic guidance, a 13 mm width x 15 mm depth x 4.41mm height Mobi-C was then inserted in the prepared disc space   With the disc arthroplasty in good position and verified with AP and lateral x-rays, the wound was irrigated copiously and meticulous hemostasis obtained.  Wound was closed in 2 layers using interrupted inverted 3-0 Vicryl sutures.  The wound was dressed with dermabond, the head of bed at 30 degrees, taken to recovery room in stable condition.  No new postop neurological deficits were identified.  Intraoperative neuromonitoring was utilized and stable throughout the procedure.  No evidence of motor or sensory changes.  Sponge and pattie counts were correct at the end of the procedure.   I performed the entire procedure with the assistance of Edsel Goods, PA-C as an Designer, television/film set. An assistant was required for this procedure due to the complexity.  The assistant provided assistance in tissue manipulation and suction, and was required for the successful and safe performance of the procedure. I performed the critical portions of the procedure.   Penne MICAEL Sharps, MD  Implant Name Type Inv. Item Serial No. Manufacturer Lot No. LRB No. Used Action  DISC MOBI-C CERVICAL 13X15 4.5 - [id number] Miscellaneous DISC MOBI-C CERVICAL 13X15 4.5  [facility] [id number] N/A 1 Implanted  DISC MOBI-C CERVICAL 13X15 4.5 - [id number] Miscellaneous DISC MOBI-C CERVICAL 13X15 4.5  [facility] [id number] N/A 1 Implanted

## 2024-02-22 NOTE — Interval H&P Note (Signed)
 History and Physical Interval Note:  02/22/2024 12:16 PM  Jean Rios  has presented today for surgery, with the diagnosis of Cervical Spondylosis Causing Cervical Myeloradiculopathy.  The various methods of treatment have been discussed with the patient and family. After consideration of risks, benefits and other options for treatment, the patient has consented to  Procedure(s): Anterior Cervical Disk Arthroplasty, C5-7 (N/A) as a surgical intervention.  The patient's history has been reviewed, patient examined, no change in status, stable for surgery.  I have reviewed the patient's chart and labs.  Questions were answered to the patient's satisfaction.    Heart and lungs clear    [name]
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
## 2024-02-23 ENCOUNTER — Other Ambulatory Visit: Payer: Self-pay

## 2024-02-23 ENCOUNTER — Encounter: Payer: Self-pay | Admitting: Neurosurgery

## 2024-02-23 DIAGNOSIS — M4712 Other spondylosis with myelopathy, cervical region: Secondary | ICD-10-CM | POA: Diagnosis not present

## 2024-02-23 MED ORDER — SENNA 8.6 MG PO TABS
1.0000 | ORAL_TABLET | Freq: Two times a day (BID) | ORAL | 0 refills | Status: AC | PRN
  Filled 2024-02-23: qty 30, 15d supply, fill #0

## 2024-02-23 MED ORDER — OXYCODONE HCL 5 MG PO TABS
5.0000 mg | ORAL_TABLET | ORAL | 0 refills | Status: DC | PRN
  Filled 2024-02-23: qty 45, 4d supply, fill #0

## 2024-02-23 MED ORDER — POLYETHYLENE GLYCOL 3350 17 GM/SCOOP PO POWD
17.0000 g | Freq: Every day | ORAL | 0 refills | Status: AC | PRN
  Filled 2024-02-23: qty 238, 14d supply, fill #0

## 2024-02-23 MED ORDER — ACETAMINOPHEN 500 MG PO TABS: 1000.0000 mg | ORAL_TABLET | Freq: Four times a day (QID) | ORAL | Status: DC

## 2024-02-23 MED ORDER — CYCLOBENZAPRINE HCL 10 MG PO TABS
10.0000 mg | ORAL_TABLET | Freq: Three times a day (TID) | ORAL | 0 refills | Status: DC | PRN
  Filled 2024-02-23: qty 90, 30d supply, fill #0

## 2024-02-23 NOTE — Plan of Care (Signed)
  Problem: Education: Goal: Ability to verbalize activity precautions or restrictions will improve Outcome: Progressing   Problem: Activity: Goal: Ability to avoid complications of mobility impairment will improve Outcome: Progressing   Problem: Bowel/Gastric: Goal: Gastrointestinal status for postoperative course will improve Outcome: Progressing   Problem: Pain Management: Goal: Pain level will decrease Outcome: Progressing   Problem: Health Behavior/Discharge Planning: Goal: Identification of resources available to assist in meeting health care needs will improve Outcome: Progressing

## 2024-02-23 NOTE — Progress Notes (Signed)
 DISCHARGE NOTE:  Pt was discharged with IV removed and dc instructions given. Pt received mediations delivered to hospital room. Pt voices no questions or concerns at this time. Pt wheeled down to medical mall entrance by staff. Pt's husband provided transportation.

## 2024-02-23 NOTE — Discharge Summary (Signed)
 Discharge Summary  Patient ID: Jean Rios MRN: [id number] DOB/AGE: Nov 22, 1975 48 y.o.  Admit date: 02/22/2024 Discharge date: 02/23/2024  Admission Diagnoses: Cervical myeloradiculopathy  Discharge Diagnoses:  Principal Problem:   Cervical radiculopathy Active Problems:   Cervical spondylosis   Spondylosis, cervical, with myelopathy   Discharged Condition: good  Hospital Course:  Jean Rios is a 48 y.o presenting with cervical myeloradiculopathy.  She underwent a C5-7 arthroplasty.  Her intraoperative course was uncomplicated.  She was admitted overnight for pain control and therapy evaluation.  She was seen and evaluated by therapy and deemed appropriate for discharge home on postop day 1.  Her pain was well-controlled on oral medications.  She experienced some right shoulder pain but reported significant improvement from her preop radicular symptoms.  She was discharged home on postop day 1 with prescriptions for Oxycodone , Flexeril , Senna and Miralax to take as needed  Consults: None  Significant Diagnostic Studies: NA  Treatments: surgery: as above. Please see separately dictated operative report for further details   Discharge Exam: Blood pressure 105/61, pulse 62, temperature 97.6 F (36.4 C), temperature source Oral, resp. rate 15, height 5' 5 (1.651 m), weight 77.7 kg, SpO2 95%. AA Ox3 CNI   Strength: Side Biceps Triceps Deltoid Interossei Grip Wrist Ext. Wrist Flex.  R 4+ 5 5 5 5 5 5   L 5 5 5 5 5 5 5    Incision: c/d/I with dermabond in place   Disposition: Discharge disposition: 01-Home or Self Care       Discharge Instructions     Incentive spirometry RT   Complete by: As directed    No wound care   Complete by: As directed       Allergies as of 02/23/2024       Reactions   Fentanyl Other (See Comments), Shortness Of Breath, Nausea And Vomiting   Chest tightness Passes out Feels like dying States she will become  lightheaded with SOB, then will pass out.    Chest tightness Passes out Feels like dying   Cinnamon Itching   Throat gets irritated, and have difficulty to breath   Dust Mite Extract Hives, Itching   Gabapentin Other (See Comments)   CAUSES CONFUSION BLACKOUTS, LOOSES TIME   Mixed Feathers Itching        Medication List     STOP taking these medications    ibuprofen  600 MG tablet Commonly known as: ADVIL        TAKE these medications    acetaminophen  500 MG tablet Commonly known as: TYLENOL  Take 2 tablets (1,000 mg total) by mouth every 6 (six) hours.   Ajovy  225 MG/1.5ML Soaj Generic drug: Fremanezumab -vfrm Inject 225 mg into the skin every 30 (thirty) days.   ARIPiprazole 2 MG tablet Commonly known as: ABILIFY Take 2 mg by mouth at bedtime.   aspirin  EC 81 MG tablet Take 1 tablet (81 mg total) by mouth daily. Swallow whole.   buPROPion  100 MG tablet Commonly known as: WELLBUTRIN  Take 100 mg by mouth in the morning.   celecoxib  200 MG capsule Commonly known as: CELEBREX  Take 200 mg by mouth 2 (two) times daily.   cyclobenzaprine  10 MG tablet Commonly known as: FLEXERIL  Take 1 tablet (10 mg total) by mouth 3 (three) times daily as needed for muscle spasms. What changed: See the new instructions.   FLUoxetine 40 MG capsule Commonly known as: PROZAC Take 40 mg by mouth 2 (two) times daily.   levocetirizine 5 MG tablet Commonly known as: XYZAL   Take 10 mg by mouth every evening.   mometasone 0.1 % cream Commonly known as: ELOCON Apply 1 Application topically at bedtime as needed (break through hives).   multivitamin with minerals Tabs tablet Take 1 tablet by mouth in the morning.   Nurtec 75 MG Tbdp Generic drug: Rimegepant Sulfate Take 1 tablet (75 mg total) by mouth daily as needed.   omeprazole  20 MG tablet Commonly known as: PRILOSEC OTC Take 20 mg by mouth daily before breakfast.   oxyCODONE  5 MG immediate release tablet Commonly known  as: Oxy IR/ROXICODONE  Take 1-2 tablets (5-10 mg total) by mouth every 4 (four) hours as needed for moderate pain (pain score 4-6).   polyethylene glycol 17 g packet Commonly known as: MIRALAX / GLYCOLAX Take 17 g by mouth daily as needed for moderate constipation.   pregabalin  100 MG capsule Commonly known as: LYRICA  Take 100 mg by mouth 2 (two) times daily.   QUEtiapine 50 MG tablet Commonly known as: SEROQUEL Take 50 mg by mouth at bedtime.   rosuvastatin  20 MG tablet Commonly known as: CRESTOR  TAKE 1 TABLET BY MOUTH EVERY DAY What changed: when to take this   SALONPAS LIDOCAINE PLUS EX Place 1 patch onto the skin daily. Salonpas Lidocaine 4% Pain Relieving Gel-Patch   senna 8.6 MG Tabs tablet Commonly known as: SENOKOT Take 1 tablet (8.6 mg total) by mouth 2 (two) times daily as needed for mild constipation.         Signed: Edsel Jama Goods 02/23/2024, 10:43 AM

## 2024-02-23 NOTE — Anesthesia Postprocedure Evaluation (Signed)
 Anesthesia Post Note  Patient: Jean Rios  Procedure(s) Performed: Anterior Cervical Disk Arthroplasty, C5-7 (Spine Cervical)  Patient location during evaluation: PACU Anesthesia Type: General Level of consciousness: awake and alert Pain management: pain level controlled Vital Signs Assessment: post-procedure vital signs reviewed and stable Respiratory status: spontaneous breathing, nonlabored ventilation, respiratory function stable and patient connected to nasal cannula oxygen Cardiovascular status: blood pressure returned to baseline and stable Postop Assessment: no apparent nausea or vomiting Anesthetic complications: no   No notable events documented.   Last Vitals:  Vitals:   02/22/24 2310 02/23/24 0431  BP: 116/67 107/65  Pulse: 94 82  Resp: 18 16  Temp: (!) 36.3 C 36.6 C  SpO2: 95% 95%    Last Pain:  Vitals:   02/23/24 0524  TempSrc:   PainSc: Asleep                 [name]

## 2024-02-23 NOTE — Evaluation (Signed)
 Occupational Therapy Evaluation Patient Details Name: Jean Rios MRN: [id number] DOB: 25-Nov-1975 Today's Date: 02/23/2024   History of Present Illness   Pt is a 48 year old female s/p C5-C7 anterior cervical disc arthroplasty 02/22/24     Clinical Impressions Chart reviewed to date, pt greeted semi supine in bed, agreeable to OT evaluation. PTA pt is MOD I-I in ADL/IADL,amb with no AD. She works as an Primary school teacher. Pt provided education re: cervical precautions, self care skills, AE, and home/routines modifications to maximize safety and functional independence while minimizing falls risk and maintaining precautions. Pt verbalized understanding of all education/training provided. Able to return demonstration safe techniques while maintaining cervical precautions throughout. Handout provided to support recall and carry over of learned precautions/techniques for bed mobility, functional transfers, and self care skills. Pt is left in care of PT in hallway, all needs met. OT will follow acutely.      If plan is discharge home, recommend the following:   Assistance with cooking/housework;Assist for transportation     Functional Status Assessment   Patient has had a recent decline in their functional status and demonstrates the ability to make significant improvements in function in a reasonable and predictable amount of time.     Equipment Recommendations   None recommended by OT;Other (comment) (pt has recommended equipment)     Recommendations for Other Services         Precautions/Restrictions   Precautions Precautions: Fall;Cervical Precaution Booklet Issued: Yes (comment) Recall of Precautions/Restrictions: Intact Precaution/Restrictions Comments: no brace needed Restrictions Weight Bearing Restrictions Per Provider Order: No     Mobility Bed Mobility Overal bed mobility: Needs Assistance Bed Mobility: Rolling, Sidelying to Sit Rolling:  Supervision Sidelying to sit: Supervision            Transfers Overall transfer level: Needs assistance   Transfers: Sit to/from Stand Sit to Stand: Modified independent (Device/Increase time), Supervision           General transfer comment: supervision, progress to MOD I      Balance Overall balance assessment: Needs assistance Sitting-balance support: Feet supported Sitting balance-Leahy Scale: Good     Standing balance support: No upper extremity supported Standing balance-Leahy Scale: Good                             ADL either performed or assessed with clinical judgement   ADL Overall ADL's : Needs assistance/impaired Eating/Feeding: Modified independent   Grooming: Modified independent;Standing               Lower Body Dressing: Supervision/safety;Sitting/lateral leans Lower Body Dressing Details (indicate cue type and reason): donn commpression socks using figure 4 technique Toilet Transfer: Supervision/safety;Ambulation;Regular Toilet;Grab bars           Functional mobility during ADLs: Supervision/safety (approx 14' with no AD, then hand off to PT)       Vision Patient Visual Report: No change from baseline       Perception         Praxis         Pertinent Vitals/Pain Pain Assessment Pain Assessment: Faces Faces Pain Scale: Hurts a little bit Pain Location: pulling in her neck, down R shoulder Pain Descriptors / Indicators: Discomfort Pain Intervention(s): Repositioned, Monitored during session     Extremity/Trunk Assessment Upper Extremity Assessment Upper Extremity Assessment: Generalized weakness;Right hand dominant;RUE deficits/detail RUE Deficits / Details: mild B hand grip strength weakness (R>L), does not appear to  impact pt functionally RUE Sensation: WNL RUE Coordination: WNL   Lower Extremity Assessment Lower Extremity Assessment: Defer to PT evaluation       Communication  Communication Communication: No apparent difficulties   Cognition Arousal: Alert Behavior During Therapy: WFL for tasks assessed/performed Cognition: No apparent impairments                               Following commands: Intact       Cueing  General Comments   Cueing Techniques: Verbal cues  vss   Exercises Other Exercises Other Exercises: provided patient with red therapy sponge for improved B hand strength   Shoulder Instructions      Home Living Family/patient expects to be discharged to:: Private residence Living Arrangements: Spouse/significant other;Parent (MIL) Available Help at Discharge: Family Type of Home: House Home Access: Stairs to enter [employer] of Steps: thresh hold step in the back, 3 steps in the front that has a rail on the L(rarely use) Entrance Stairs-Rails: None Home Layout: One level     Bathroom Shower/Tub: Chief Strategy Officer: Standard (mid range) Bathroom Accessibility: Yes   Home Equipment: Agricultural consultant (2 wheels);BSC/3in1;Toilet riser;Grab bars - tub/shower;Shower seat   Additional Comments: shower chair with no back      Prior Functioning/Environment Prior Level of Function : Independent/Modified Independent                    OT Problem List: Decreased strength;Decreased activity tolerance;Impaired balance (sitting and/or standing)   OT Treatment/Interventions: Self-care/ADL training;Balance training;Therapeutic exercise;Therapeutic activities;DME and/or AE instruction;Patient/family education      OT Goals(Current goals can be found in the care plan section)   Acute Rehab OT Goals Patient Stated Goal: go home OT Goal Formulation: With patient Time For Goal Achievement: 03/08/24 Potential to Achieve Goals: Good ADL Goals Pt Will Transfer to Toilet: with modified independence;ambulating Pt/caregiver will Perform Home Exercise Program: Increased strength;Both right and  left upper extremity   OT Frequency:  Min 3X/week    Co-evaluation              AM-PAC OT 6 Clicks Daily Activity     Outcome Measure Help from another person eating meals?: None Help from another person taking care of personal grooming?: None Help from another person toileting, which includes using toliet, bedpan, or urinal?: None Help from another person bathing (including washing, rinsing, drying)?: A Little Help from another person to put on and taking off regular upper body clothing?: None Help from another person to put on and taking off regular lower body clothing?: None 6 Click Score: 23   End of Session    Activity Tolerance: Patient tolerated treatment well Patient left: Other (comment) (in hallway in care of PT)  OT Visit Diagnosis: Other abnormalities of gait and mobility (R26.89)                Time: 9080-9060 OT Time Calculation (min): 20 min Charges:  OT General Charges $OT Visit: 1 Visit OT Evaluation $OT Eval Low Complexity: 1 Low  Therisa Sheffield, OTD OTR/L  02/23/24, 9:49 AM

## 2024-02-23 NOTE — Plan of Care (Signed)
  Problem: Clinical Measurements: Goal: Postoperative complications will be avoided or minimized Outcome: Progressing   

## 2024-02-23 NOTE — Progress Notes (Signed)
   Neurosurgery Progress Note  History: Joua Espina is s/p C5-7 arthroplasty   POD1: Pt doing well this morning with significant improvement of pre-op arm pain and numbness/ tingling. She reports some discomfort in her right neck and shoulder which is largely controlled with orals meds.   Physical Exam: Vitals:   02/22/24 2310 02/23/24 0431  BP: 116/67 107/65  Pulse: 94 82  Resp: 18 16  Temp: (!) 97.3 F (36.3 C) 97.8 F (36.6 C)  SpO2: 95% 95%    AA Ox3 CNI  Strength: Side Biceps Triceps Deltoid Interossei Grip Wrist Ext. Wrist Flex.  R 4+ 5 5 5 5 5 5   L 5 5 5 5 5 5 5    Incision: c/d/I with dermabond in place  Data:  Other tests/results: NA  Assessment/Plan:  Caliyah Sieh is a 48 y.o presenting with bilateral radiculopathy s/p C5-7 arthroplasty   - mobilize - pain control - DVT prophylaxis - PTOT; dispo planning underway  Edsel Goods PA-C Department of Neurosurgery

## 2024-02-23 NOTE — Evaluation (Signed)
 Physical Therapy Evaluation Patient Details Name: Jean Rios MRN: [id number] DOB: 1975/07/27 Today's Date: 02/23/2024  History of Present Illness  Pt is a 48 year old female s/p C5-C7 anterior cervical disc arthroplasty 02/22/24  Clinical Impression  Pt did well with all aspects of mobility and ambulation, showed good understanding of precautions/limitations and progression on activity once cued.  She does have surgical associated pain in R neck/shoulder with great improvement with numbness and radiating pain.  Pt will have assist at home and showed no overt safety concerns, continued PT per surgical protocols.      If plan is discharge home, recommend the following: Assist for transportation   Can travel by private vehicle        Equipment Recommendations None recommended by PT  Recommendations for Other Services       Functional Status Assessment Patient has had a recent decline in their functional status and demonstrates the ability to make significant improvements in function in a reasonable and predictable amount of time.     Precautions / Restrictions Precautions Precautions: Fall;Cervical Precaution Booklet Issued: Yes (comment) Recall of Precautions/Restrictions: Intact Precaution/Restrictions Comments: no brace needed Restrictions Weight Bearing Restrictions Per Provider Order: No      Mobility  Bed Mobility               General bed mobility comments: seated pre/post session, does not show any indication of issues with positional changes    Transfers Overall transfer level: Modified independent Equipment used: None Transfers: Sit to/from Stand Sit to Stand: Modified independent (Device/Increase time), Supervision           General transfer comment: able to transition to/from supine/sit with ease    Ambulation/Gait Ambulation/Gait assistance: Modified independent (Device/Increase time) Gait Distance (Feet): 200 Feet Assistive device:  None         General Gait Details: Pt was able to maintain consistent and communtiy appropriate cadence w/o issue, faitgue or safety concerns.  Stairs            Wheelchair Mobility     Tilt Bed    Modified Rankin (Stroke Patients Only)       Balance Overall balance assessment: Needs assistance Sitting-balance support: Feet supported Sitting balance-Leahy Scale: Good     Standing balance support: No upper extremity supported Standing balance-Leahy Scale: Good                               Pertinent Vitals/Pain Pain Assessment Pain Assessment: 0-10 Pain Score: 4  Pain Location: R neck, trap, shoulder Pain Descriptors / Indicators: Discomfort Pain Intervention(s): Limited activity within patient's tolerance    Home Living Family/patient expects to be discharged to:: Private residence Living Arrangements: Spouse/significant other;Parent Available Help at Discharge: Family Type of Home: House Home Access: Stairs to enter Entrance Stairs-Rails: None Entrance Stairs-Number of Steps: thresh hold step in the back, 3 steps in the front that has a rail on the L(rarely use)   Home Layout: One level Home Equipment: Agricultural consultant (2 wheels);BSC/3in1;Toilet riser;Grab bars - tub/shower;Shower seat Additional Comments: shower chair with no back    Prior Function Prior Level of Function : Independent/Modified Independent             Mobility Comments: Pt works, does EEGs ADLs Comments: indepdendent, UE numbness, etc becoming an issue more recently     Extremity/Trunk Assessment   Upper Extremity Assessment Upper Extremity Assessment: Generalized weakness RUE Deficits /  Details: mild B hand grip strength weakness (R>L), does not appear to impact pt functionally RUE Sensation: WNL (general mild weakness (pain hesitancy?) on the R) RUE Coordination: WNL    Lower Extremity Assessment Lower Extremity Assessment: Overall WFL for tasks assessed        Communication   Communication Communication: No apparent difficulties    Cognition Arousal: Alert Behavior During Therapy: WFL for tasks assessed/performed                             Following commands: Intact       Cueing Cueing Techniques: Verbal cues     General Comments General comments (skin integrity, edema, etc.): Pt endorses some neck/surgical pain as well as R shoulder/tra, etc but functionally moved very well    Exercises     Assessment/Plan    PT Assessment Patient needs continued PT services  PT Problem List Decreased strength;Pain       PT Treatment Interventions Functional mobility training;Neuromuscular re-education;Patient/family education    PT Goals (Current goals can be found in the Care Plan section)  Acute Rehab PT Goals Patient Stated Goal: go home today PT Goal Formulation: With patient Time For Goal Achievement: 03/01/24 Potential to Achieve Goals: Good    Frequency 7X/week     Co-evaluation               AM-PAC PT 6 Clicks Mobility  Outcome Measure Help needed turning from your back to your side while in a flat bed without using bedrails?: None Help needed moving from lying on your back to sitting on the side of a flat bed without using bedrails?: None Help needed moving to and from a bed to a chair (including a wheelchair)?: None Help needed standing up from a chair using your arms (e.g., wheelchair or bedside chair)?: None Help needed to walk in hospital room?: None Help needed climbing 3-5 steps with a railing? : None 6 Click Score: 24    End of Session Equipment Utilized During Treatment: Gait belt Activity Tolerance: Patient tolerated treatment well Patient left: in chair;with call bell/phone within reach Nurse Communication: Need for lift equipment PT Visit Diagnosis: Muscle weakness (generalized) (M62.81);Other symptoms and signs involving the nervous system (R29.898)    Time: 9059-9047 PT Time  Calculation (min) (ACUTE ONLY): 12 min   Charges:   PT Evaluation $PT Eval Low Complexity: 1 Low   PT General Charges $$ ACUTE PT VISIT: 1 Visit         Carmin JONELLE Deed, DPT 02/23/2024, 12:25 PM

## 2024-03-03 NOTE — Progress Notes (Signed)
   REFERRING PHYSICIAN:  Romaine Jodean RAMAN, Md [address] [place],  [place] [address]  DOS: 02/22/24  C5-C7 cervical arthroplasty   HISTORY OF PRESENT ILLNESS: Jean Rios is 2 weeks status post above surgery. Given oxycodone  and flexeril  on discharge from the hospital.   She was doing pretty well after her surgery with mild pain.   She had a fall on 03/04/24 - was squatting down and fell on right shoulder. Since that time, she has constant left sided neck pain into her shoulder with intermittent electric pain in left > right arm to her fingers. She also has intermittent numbness in left thumb, ring and small finger. Intermittent numbness in right thumb and small finger. She has a lot of posterior neck and shoulder blade pain as well.  Swallowing has improved.   She is taking oxycodone  prn (maybe bid). She is taking flexeril  tid prn. She continues on lyrica  and celebrex .   She notes intermittent difficulty with finding words x 3 months, but has been more frequent in since fall. She does not think she hit her head when she fell. She also notes intermittent dizziness since her fall along with new balance issues.    PHYSICAL EXAMINATION:  NEUROLOGICAL:  General: In no acute distress.   Awake, alert, oriented to person, place, and time.  Pupils equal round and reactive to light.  Facial tone is symmetric.    Strength: Side Biceps Triceps Deltoid Interossei Grip Wrist Ext. Wrist Flex.  R 5 5 5 5 5 5 5   L 5 5 5 5 5 5 5    Side Iliopsoas Quads Hamstring PF DF EHL  R 5 5 5 5 5 5   L 5 5 5 5 5 5    Cervical incision c/d/I  She has mild posterior cervical tenderness along with left trapezial and bilateral periscapular tenderness.   Sensation is grossly intact to light touch in bilateral upper and lower extremities, but slightly diminished in right side (both arm and leg) compared to left side.   Imaging:  Cervical xrays dated 03/06/24:  No complications noted.    Report for above xrays not yet available.   Assessment / Plan: Jean Rios is doing fair s/p above surgery. Was doing well until fall on Saturday. Treatment options reviewed with patient and following plan made:   - We discussed activity escalation and I have advised the patient to lift up to 10 pounds until 6 weeks after surgery (until follow up with Dr. Claudene).   - Reviewed wound care.  - Continue current medications including prn oxycodone , prn flexeril , lyrica , and celebrex .  - Refill given on oxycodone  and directions changed to 1 q 4 hours prn. PMP reviewed and is appropriate.  - Cervical xrays on her way out. Will message her with results.  - She remains out of work until her follow up. Note given.  - Follow up as scheduled in 4 weeks and prn.   Of note, Dr. Claudene wanted to get repeat cervical MRI in 6-12 months from 09/29/23 to check on T2 changes in cord at level of C2.   Will review above with Dr. Claudene and message her with his further recommendations.   Advised to contact the office if any questions or concerns arise.   Glade Boys PA-C Dept of Neurosurgery

## 2024-03-05 ENCOUNTER — Encounter: Payer: Self-pay | Admitting: Neurosurgery

## 2024-03-06 ENCOUNTER — Ambulatory Visit: Admitting: Orthopedic Surgery

## 2024-03-06 ENCOUNTER — Encounter: Payer: Self-pay | Admitting: Orthopedic Surgery

## 2024-03-06 ENCOUNTER — Ambulatory Visit

## 2024-03-06 VITALS — BP 116/70 | Temp 98.0°F | Ht 65.0 in | Wt 168.0 lb

## 2024-03-06 DIAGNOSIS — M4722 Other spondylosis with radiculopathy, cervical region: Secondary | ICD-10-CM

## 2024-03-06 DIAGNOSIS — M47812 Spondylosis without myelopathy or radiculopathy, cervical region: Secondary | ICD-10-CM

## 2024-03-06 DIAGNOSIS — Z9889 Other specified postprocedural states: Secondary | ICD-10-CM

## 2024-03-06 DIAGNOSIS — Z09 Encounter for follow-up examination after completed treatment for conditions other than malignant neoplasm: Secondary | ICD-10-CM

## 2024-03-06 DIAGNOSIS — W19XXXA Unspecified fall, initial encounter: Secondary | ICD-10-CM

## 2024-03-06 MED ORDER — OXYCODONE HCL 5 MG PO TABS: 5.0000 mg | ORAL_TABLET | ORAL | 0 refills | Status: DC | PRN

## 2024-03-09 ENCOUNTER — Telehealth: Payer: Self-pay | Admitting: Diagnostic Neuroimaging

## 2024-03-09 DIAGNOSIS — M47812 Spondylosis without myelopathy or radiculopathy, cervical region: Secondary | ICD-10-CM

## 2024-03-09 DIAGNOSIS — Z9889 Other specified postprocedural states: Secondary | ICD-10-CM

## 2024-03-09 NOTE — Telephone Encounter (Signed)
 Noted

## 2024-03-09 NOTE — Telephone Encounter (Signed)
 Patient said in the past two since coming out of surgery. Having trouble remembering (can't remember why I went to a room) and lost of words in conversation. Neurosurgeon advised to get an appointment to see neurologist.    Informed patient would need a referral for new symptom

## 2024-03-24 ENCOUNTER — Other Ambulatory Visit: Payer: Self-pay | Admitting: Family Medicine

## 2024-03-24 DIAGNOSIS — M5412 Radiculopathy, cervical region: Secondary | ICD-10-CM

## 2024-04-03 ENCOUNTER — Encounter: Payer: Self-pay | Admitting: Neurosurgery

## 2024-04-03 ENCOUNTER — Ambulatory Visit

## 2024-04-03 ENCOUNTER — Ambulatory Visit: Admitting: Neurosurgery

## 2024-04-03 DIAGNOSIS — M4722 Other spondylosis with radiculopathy, cervical region: Secondary | ICD-10-CM

## 2024-04-03 DIAGNOSIS — M47812 Spondylosis without myelopathy or radiculopathy, cervical region: Secondary | ICD-10-CM

## 2024-04-03 DIAGNOSIS — Z9889 Other specified postprocedural states: Secondary | ICD-10-CM

## 2024-04-03 DIAGNOSIS — M5412 Radiculopathy, cervical region: Secondary | ICD-10-CM | POA: Diagnosis not present

## 2024-04-03 MED ORDER — OXYCODONE HCL 5 MG PO TABS: 5.0000 mg | ORAL_TABLET | ORAL | 0 refills | Status: DC | PRN

## 2024-04-03 MED ORDER — CYCLOBENZAPRINE HCL 10 MG PO TABS: 10.0000 mg | ORAL_TABLET | Freq: Three times a day (TID) | ORAL | 0 refills | Status: DC | PRN

## 2024-04-03 NOTE — Progress Notes (Signed)
   REFERRING PHYSICIAN:  Romaine Jodean RAMAN, Md [address] [place],  [place] [address]  Surgery Date: 02/22/24  C5-C7 cervical arthroplasty   Discussed the use of AI scribe software for clinical note transcription with the patient, who gave verbal consent to proceed.  History of Present Illness Jean Rios is a 48 year old female with carpal tunnel syndrome who presents with numbness in her hands, she is following up after C5-C7 cervical disc arthroplasty.  She has had a significant improvement in regards to her cervical disc arthroplasty symptoms.  She states that now that she is having much better control of her neck and bilateral upper extremity issues that she has noticed her carpal tunnel bothering her more significantly.  She has been previously diagnosed and previously wore wrist braces.  She does feel that she wakes up with numbness and tingling in her carpal tunnel distribution bilaterally.  She has to wake up and wring out her hands.  Otherwise is doing well.SABRA   PHYSICAL EXAMINATION:  NEUROLOGICAL:  General: In no acute distress.   Awake, alert, oriented to person, place, and time.  Pupils equal round and reactive to light.  Facial tone is symmetric.    Strength: Side Biceps Triceps Deltoid Interossei Grip Wrist Ext. Wrist Flex.  R 5 5 5 5 5 5 5   L 5 5 5 5 5 5 5    Side Iliopsoas Quads Hamstring PF DF EHL  R 5 5 5 5 5 5   L 5 5 5 5 5 5    Cervical incision c/d/I  She has mild posterior cervical tenderness along with left trapezial and bilateral periscapular tenderness.   Sensation is grossly intact to light touch in bilateral upper and lower extremities, but slightly diminished in right side (both arm and leg) compared to left side.   Imaging:  Cervical xrays dated 03/06/24:  No complications noted.   Report for above xrays not yet available.   Assessment / Plan: Jean Rios is doing fair s/p above surgery.  She feels like her symptoms are  significantly better.  She does complain of bilateral carpal tunnel syndrome which she has known about chronically and has been previously braced for before.  We did discuss we trying her bilateral wrist braces as this can help with her overnight symptoms.  From a cervical standpoint she has had a significant improvement.  Will continue to follow as planned.  [name] MD Dept of Neurosurgery

## 2024-04-06 ENCOUNTER — Telehealth: Payer: Self-pay | Admitting: Pharmacist

## 2024-04-06 NOTE — Telephone Encounter (Signed)
 Pharmacy Patient Advocate Encounter   Received notification from CoverMyMeds that prior authorization for Nurtec 75MG  dispersible tablets is required/requested.   Insurance verification completed.   The patient is insured through [facility].   Per test claim: PA required; PA submitted to above mentioned insurance via Latent Key/confirmation #/EOC [id number] Status is pending

## 2024-04-10 NOTE — Telephone Encounter (Signed)
 Pharmacy Patient Advocate Encounter  Received notification from [facility] that Prior Authorization for Nurtec 75MG  dispersible tablets has been APPROVED from 04/10/2024 to 04/09/2025   PA #/Case ID/Reference #: [id number]

## 2024-04-11 ENCOUNTER — Ambulatory Visit: Payer: Self-pay | Admitting: Neurosurgery

## 2024-04-11 ENCOUNTER — Encounter: Payer: Self-pay | Admitting: Diagnostic Neuroimaging

## 2024-04-11 ENCOUNTER — Telehealth: Admitting: Diagnostic Neuroimaging

## 2024-04-11 DIAGNOSIS — R4789 Other speech disturbances: Secondary | ICD-10-CM | POA: Diagnosis not present

## 2024-04-11 MED ORDER — AJOVY 225 MG/1.5ML ~~LOC~~ SOAJ: 225.0000 mg | SUBCUTANEOUS | 12 refills | Status: AC

## 2024-04-11 MED ORDER — NURTEC 75 MG PO TBDP: 75.0000 mg | ORAL_TABLET | Freq: Every day | ORAL | 12 refills | Status: AC | PRN
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
## 2024-04-28 ENCOUNTER — Inpatient Hospital Stay: Admission: RE | Admit: 2024-04-28

## 2024-05-15 ENCOUNTER — Other Ambulatory Visit

## 2024-05-15 ENCOUNTER — Encounter: Admitting: Orthopedic Surgery

## 2024-05-17 ENCOUNTER — Other Ambulatory Visit: Payer: Self-pay | Admitting: Orthopedic Surgery

## 2024-05-17 DIAGNOSIS — G959 Disease of spinal cord, unspecified: Secondary | ICD-10-CM

## 2024-05-19 NOTE — Progress Notes (Unsigned)
" ° °  REFERRING PHYSICIAN:  Romaine Jodean RAMAN, Md [address] [place],  [place] [address]  DOS: 02/22/24  C5-C7 cervical arthroplasty   HISTORY OF PRESENT ILLNESS:  She was doing better at her follow up with Dr. Claudene on 04/03/24. She has noted more symptoms from her carpal tunnel syndrome bilaterally.   She was to wear her carpal tunnel braces at night.       She had a fall on 03/04/24 - was squatting down and fell on right shoulder. Since that time, she has constant left sided neck pain into her shoulder with intermittent electric pain in left > right arm to her fingers. She also has intermittent numbness in left thumb, ring and small finger. Intermittent numbness in right thumb and small finger. She has a lot of posterior neck and shoulder blade pain as well.  Swallowing has improved.   She is taking oxycodone  prn (maybe bid). She is taking flexeril  tid prn. She continues on lyrica  and celebrex .   She notes intermittent difficulty with finding words x 3 months, but has been more frequent in since fall. She does not think she hit her head when she fell. She also notes intermittent dizziness since her fall along with new balance issues.    PHYSICAL EXAMINATION:  NEUROLOGICAL:  General: In no acute distress.   Awake, alert, oriented to person, place, and time.  Pupils equal round and reactive to light.  Facial tone is symmetric.    Strength: Side Biceps Triceps Deltoid Interossei Grip Wrist Ext. Wrist Flex.  R 5 5 5 5 5 5 5   L 5 5 5 5 5 5 5    Side Iliopsoas Quads Hamstring PF DF EHL  R 5 5 5 5 5 5   L 5 5 5 5 5 5    Cervical incision well healed.  Sensation is grossly intact to light touch in bilateral upper and lower extremities, but slightly diminished in right side (both arm and leg) compared to left side.   Imaging:  Cervical xrays dated ***:  No complications noted. ***  Report for above xrays not yet available.   Assessment / Plan: Jean Rios is  doing well s/p above surgery. Treatment options reviewed with patient and following plan made:   - She can return to activity as tolerated.  - Follow up with Dr. Claudene in 6 months with repeat xrays.   Of note, Dr. Claudene wanted to get repeat cervical MRI in 6-12 months from 09/29/23 to check on T2 changes in cord at level of C2. ***  Advised to contact the office if any questions or concerns arise.   Glade Boys PA-C Dept of Neurosurgery  "

## 2024-05-23 ENCOUNTER — Ambulatory Visit (INDEPENDENT_AMBULATORY_CARE_PROVIDER_SITE_OTHER)

## 2024-05-23 ENCOUNTER — Encounter: Payer: Self-pay | Admitting: Orthopedic Surgery

## 2024-05-23 ENCOUNTER — Ambulatory Visit: Admitting: Orthopedic Surgery

## 2024-05-23 VITALS — BP 124/82 | Temp 98.4°F | Ht 65.0 in | Wt 177.0 lb

## 2024-05-23 DIAGNOSIS — M4726 Other spondylosis with radiculopathy, lumbar region: Secondary | ICD-10-CM

## 2024-05-23 DIAGNOSIS — M48061 Spinal stenosis, lumbar region without neurogenic claudication: Secondary | ICD-10-CM

## 2024-05-23 DIAGNOSIS — Z9889 Other specified postprocedural states: Secondary | ICD-10-CM

## 2024-05-23 DIAGNOSIS — M5416 Radiculopathy, lumbar region: Secondary | ICD-10-CM

## 2024-05-23 DIAGNOSIS — M47816 Spondylosis without myelopathy or radiculopathy, lumbar region: Secondary | ICD-10-CM

## 2024-05-23 DIAGNOSIS — G959 Disease of spinal cord, unspecified: Secondary | ICD-10-CM | POA: Diagnosis not present

## 2024-05-23 DIAGNOSIS — M5412 Radiculopathy, cervical region: Secondary | ICD-10-CM

## 2024-05-23 DIAGNOSIS — Z981 Arthrodesis status: Secondary | ICD-10-CM | POA: Diagnosis not present

## 2024-05-23 MED ORDER — CYCLOBENZAPRINE HCL 10 MG PO TABS: 10.0000 mg | ORAL_TABLET | Freq: Three times a day (TID) | ORAL | 0 refills | Status: AC | PRN

## 2024-05-30 NOTE — Telephone Encounter (Signed)
 Spoke with patient and confirmed she wants to use [facility] [place]. Authorization request submitted to the insurance by fax and patient was advised I would update her once I had information from her insurance about this.

## 2024-06-07 ENCOUNTER — Inpatient Hospital Stay: Admission: RE | Admit: 2024-06-07 | Source: Ambulatory Visit

## 2024-06-09 ENCOUNTER — Encounter: Payer: Self-pay | Admitting: Diagnostic Neuroimaging

## 2024-06-21 ENCOUNTER — Other Ambulatory Visit

## 2024-11-20 ENCOUNTER — Ambulatory Visit: Payer: Self-pay
